# Patient Record
Sex: Female | Born: 1965 | Race: White | Hispanic: No | Smoking: Current every day smoker
Health system: Southern US, Community
[De-identification: ages and names within clinical notes are randomized; demographics above are authoritative.]

## PROBLEM LIST (undated history)

## (undated) DIAGNOSIS — I10 Essential (primary) hypertension: Secondary | ICD-10-CM

## (undated) DIAGNOSIS — K219 Gastro-esophageal reflux disease without esophagitis: Secondary | ICD-10-CM

## (undated) DIAGNOSIS — I639 Cerebral infarction, unspecified: Secondary | ICD-10-CM

## (undated) DIAGNOSIS — E119 Type 2 diabetes mellitus without complications: Secondary | ICD-10-CM

## (undated) DIAGNOSIS — R569 Unspecified convulsions: Secondary | ICD-10-CM

## (undated) DIAGNOSIS — J42 Unspecified chronic bronchitis: Secondary | ICD-10-CM

## (undated) DIAGNOSIS — I341 Nonrheumatic mitral (valve) prolapse: Secondary | ICD-10-CM

## (undated) DIAGNOSIS — F419 Anxiety disorder, unspecified: Secondary | ICD-10-CM

## (undated) DIAGNOSIS — F32A Depression, unspecified: Secondary | ICD-10-CM

## (undated) DIAGNOSIS — F329 Major depressive disorder, single episode, unspecified: Secondary | ICD-10-CM

## (undated) HISTORY — DX: Anxiety disorder, unspecified: F41.9

## (undated) HISTORY — DX: Unspecified convulsions: R56.9

## (undated) HISTORY — DX: Essential (primary) hypertension: I10

## (undated) HISTORY — DX: Nonrheumatic mitral (valve) prolapse: I34.1

## (undated) HISTORY — DX: Major depressive disorder, single episode, unspecified: F32.9

## (undated) HISTORY — DX: Depression, unspecified: F32.A

## (undated) HISTORY — DX: Type 2 diabetes mellitus without complications: E11.9

## (undated) HISTORY — PX: TOTAL ABDOMINAL HYSTERECTOMY: SHX209

## (undated) HISTORY — PX: ABDOMINAL HYSTERECTOMY: SHX81

## (undated) HISTORY — PX: APPENDECTOMY: SHX54

## (undated) HISTORY — PX: CHOLECYSTECTOMY: SHX55

## (undated) HISTORY — DX: Cerebral infarction, unspecified: I63.9

---

## 1988-09-01 HISTORY — PX: TOTAL ABDOMINAL HYSTERECTOMY: SHX209

## 2017-04-25 DIAGNOSIS — L0390 Cellulitis, unspecified: Secondary | ICD-10-CM | POA: Insufficient documentation

## 2017-04-25 DIAGNOSIS — R609 Edema, unspecified: Secondary | ICD-10-CM | POA: Insufficient documentation

## 2018-11-16 ENCOUNTER — Ambulatory Visit: Payer: Self-pay | Admitting: Physician Assistant

## 2018-11-19 ENCOUNTER — Ambulatory Visit: Payer: Self-pay | Admitting: Physician Assistant

## 2018-11-22 ENCOUNTER — Ambulatory Visit (INDEPENDENT_AMBULATORY_CARE_PROVIDER_SITE_OTHER): Payer: Self-pay | Admitting: Physician Assistant

## 2018-11-22 ENCOUNTER — Other Ambulatory Visit: Payer: Self-pay

## 2018-11-22 ENCOUNTER — Encounter: Payer: Self-pay | Admitting: Physician Assistant

## 2018-11-22 VITALS — BP 141/77 | HR 55 | Temp 98.1°F | Ht 64.5 in | Wt 232.5 lb

## 2018-11-22 DIAGNOSIS — R569 Unspecified convulsions: Secondary | ICD-10-CM

## 2018-11-22 DIAGNOSIS — E1169 Type 2 diabetes mellitus with other specified complication: Secondary | ICD-10-CM | POA: Insufficient documentation

## 2018-11-22 DIAGNOSIS — F339 Major depressive disorder, recurrent, unspecified: Secondary | ICD-10-CM

## 2018-11-22 DIAGNOSIS — Z6839 Body mass index (BMI) 39.0-39.9, adult: Secondary | ICD-10-CM

## 2018-11-22 DIAGNOSIS — M5442 Lumbago with sciatica, left side: Secondary | ICD-10-CM

## 2018-11-22 DIAGNOSIS — E119 Type 2 diabetes mellitus without complications: Secondary | ICD-10-CM | POA: Insufficient documentation

## 2018-11-22 DIAGNOSIS — I6320 Cerebral infarction due to unspecified occlusion or stenosis of unspecified precerebral arteries: Secondary | ICD-10-CM

## 2018-11-22 DIAGNOSIS — E1165 Type 2 diabetes mellitus with hyperglycemia: Secondary | ICD-10-CM

## 2018-11-22 DIAGNOSIS — Z8673 Personal history of transient ischemic attack (TIA), and cerebral infarction without residual deficits: Secondary | ICD-10-CM | POA: Insufficient documentation

## 2018-11-22 DIAGNOSIS — I69398 Other sequelae of cerebral infarction: Secondary | ICD-10-CM

## 2018-11-22 DIAGNOSIS — E782 Mixed hyperlipidemia: Secondary | ICD-10-CM

## 2018-11-22 DIAGNOSIS — E559 Vitamin D deficiency, unspecified: Secondary | ICD-10-CM

## 2018-11-22 DIAGNOSIS — Z79899 Other long term (current) drug therapy: Secondary | ICD-10-CM

## 2018-11-22 DIAGNOSIS — I10 Essential (primary) hypertension: Secondary | ICD-10-CM

## 2018-11-22 DIAGNOSIS — G8929 Other chronic pain: Secondary | ICD-10-CM

## 2018-11-22 DIAGNOSIS — E66812 Obesity, class 2: Secondary | ICD-10-CM

## 2018-11-22 MED ORDER — METFORMIN HCL ER 500 MG PO TB24: 500.0000 mg | ORAL_TABLET | Freq: Every day | ORAL | 2 refills | Status: DC

## 2018-11-22 MED ORDER — AMLODIPINE BESYLATE 10 MG PO TABS: 10.0000 mg | ORAL_TABLET | Freq: Every day | ORAL | 5 refills | Status: DC

## 2018-11-22 MED ORDER — ATORVASTATIN CALCIUM 40 MG PO TABS: 40.0000 mg | ORAL_TABLET | Freq: Every day | ORAL | 5 refills | Status: DC

## 2018-11-22 MED ORDER — VENLAFAXINE HCL ER 150 MG PO CP24: 150.0000 mg | ORAL_CAPSULE | Freq: Every day | ORAL | 5 refills | Status: DC

## 2018-11-22 MED ORDER — MIRTAZAPINE 30 MG PO TABS: 30.0000 mg | ORAL_TABLET | Freq: Every day | ORAL | 5 refills | Status: DC

## 2018-11-22 MED ORDER — PREDNISONE 50 MG PO TABS: ORAL_TABLET | ORAL | 0 refills | Status: DC

## 2018-11-22 MED ORDER — OXYCODONE-ACETAMINOPHEN 10-325 MG PO TABS: 1.0000 | ORAL_TABLET | Freq: Two times a day (BID) | ORAL | 0 refills | Status: DC | PRN

## 2018-11-22 MED ORDER — LEVETIRACETAM 500 MG PO TABS: 500.0000 mg | ORAL_TABLET | Freq: Two times a day (BID) | ORAL | 5 refills | Status: DC

## 2018-11-22 MED ORDER — VITAMIN D (ERGOCALCIFEROL) 1.25 MG (50000 UNIT) PO CAPS: 50000.0000 [IU] | ORAL_CAPSULE | ORAL | 5 refills | Status: DC

## 2018-11-22 NOTE — Progress Notes (Signed)
New Patient Office Visit  Subjective:  Patient ID: Samantha [name], female    DOB: 1966/07/20  Age: 53 y.o. MRN: [id number]  CC:  Chief Complaint  Patient presents with  . Establish Care    HPI Samantha [name] presents to get intervention for her low back pain.    3 years ago the pain started. She has never had any MRI or xray. She does not know if any specific injury. She has tried PT and chiropractic care for about 1 year. She was getting some better then for the last 5 months worse. Pain is left sided through butt and into hamstring. Pain is constant but some movement makes worse. She is taking 800mg  of motrin and indocet bid. She is using some CBD oil. Worse when standing and certain positions sitting. Once she gets started walking better. No bowel or bladder dysfunction. No saddle anesthesia.   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 She recently got divorced and living with sister. She has a hx of stroke and overweight. She wants to get healthier again.  Past Medical History:  Diagnosis Date  . Anxiety   . Depression   . Diabetes mellitus without complication (HCC)   . Hypertension   . Mitral valve prolapse   . Seizures (HCC)   . Stroke [facility])       Family History  Problem Relation Age of Onset  . Hypertension Father   . Heart attack Father   . Stroke Father   . Skin cancer Father   . Breast cancer Paternal Grandmother   . Heart attack Paternal Grandfather     Social History   Socioeconomic History  . Marital status: Divorced    Spouse name: Not on file  . Number of children: Not on file  . Years of education: Not on file  . Highest education level: Not on file  Occupational History  . Not on file  Social Needs  . Financial resource strain: Not on file  . Food insecurity:    Worry: Not on file    Inability: Not on file  . Transportation needs:    Medical: Not on file    Non-medical: Not on file  Tobacco Use  . Smoking status: Current Every Day  Smoker    Packs/day: 1.00    Years: 25.00    Pack years: 25.00  . Smokeless tobacco: Never Used  Substance and Sexual Activity  . Alcohol use: Yes    Comment: socail  . Drug use: Never  . Sexual activity: Not Currently    Partners: Male    Birth control/protection: Condom  Lifestyle  . Physical activity:    Days per week: Not on file    Minutes per session: Not on file  . Stress: Not on file  Relationships  . Social connections:    Talks on phone: Not on file    Gets together: Not on file    Attends religious service: Not on file    Active member of club or organization: Not on file    Attends meetings of clubs or organizations: Not on file    Relationship status: Not on file  . Intimate partner violence:    Fear of current or ex partner: Not on file    Emotionally abused: Not on file    Physically abused: Not on file    Forced sexual activity: Not on file  Other Topics Concern  . Not on file  Social  History Narrative  . Not on file    ROS Review of Systems  Objective:   Today's Vitals: BP (!) 141/77   Pulse (!) 55   Temp 98.1 F (36.7 C) (Oral)   Ht 5' 4.5" (1.638 m)   Wt 232 lb 8 oz (105.5 kg)   LMP  (LMP Unknown)   SpO2 100%   BMI 39.29 kg/m   Physical Exam Vitals signs reviewed.  Constitutional:      Appearance: She is obese.  HENT:     Head: Normocephalic.  Cardiovascular:     Rate and Rhythm: Normal rate and regular rhythm.  Pulmonary:     Effort: Pulmonary effort is normal.     Breath sounds: Normal breath sounds.  Musculoskeletal:     Comments: Decreased ROM at waist due to pain.  Pain to palpation over lumbar spine more over L4/5 and S1.  Straight leg produced axial back pain.   Neurological:     General: No focal deficit present.     Mental Status: She is alert and oriented to person, place, and time.  Psychiatric:     Comments: Flat affect     Assessment & Plan:   Problem List Items Addressed This Visit      Unprioritized    Type II diabetes mellitus (HCC)   Relevant Medications   aspirin EC 81 MG tablet   metFORMIN (GLUCOPHAGE-XR) 500 MG 24 hr tablet   atorvastatin (LIPITOR) 40 MG tablet   Other Relevant Orders   COMPLETE METABOLIC PANEL WITH GFR   Hemoglobin A1c   Seizure as late effect of cerebrovascular accident (CVA) (HCC)   Relevant Medications   levETIRAcetam (KEPPRA) 500 MG tablet   Cerebrovascular accident (CVA) due to occlusion of precerebral artery (HCC)   Relevant Medications   aspirin EC 81 MG tablet   amLODipine (NORVASC) 10 MG tablet   atorvastatin (LIPITOR) 40 MG tablet   Other Relevant Orders   Lipid Panel w/reflex Direct LDL   COMPLETE METABOLIC PANEL WITH GFR   Essential hypertension   Relevant Medications   aspirin EC 81 MG tablet   amLODipine (NORVASC) 10 MG tablet   atorvastatin (LIPITOR) 40 MG tablet   Other Relevant Orders   COMPLETE METABOLIC PANEL WITH GFR   Vitamin D deficiency   Relevant Medications   Vitamin D, Ergocalciferol, (DRISDOL) 1.25 MG (50000 UT) CAPS capsule   Chronic left-sided low back pain with left-sided sciatica - Primary   Relevant Medications   aspirin EC 81 MG tablet   predniSONE (DELTASONE) 50 MG tablet   oxyCODONE-acetaminophen (PERCOCET) 10-325 MG tablet   levETIRAcetam (KEPPRA) 500 MG tablet   mirtazapine (REMERON) 30 MG tablet   venlafaxine XR (EFFEXOR-XR) 150 MG 24 hr capsule   Other Relevant Orders   MR Lumbar Spine Wo Contrast   Pain Mgmt, Profile 6 Conf w/o mM, U (Completed)   Class 2 severe obesity due to excess calories with serious comorbidity and body mass index (BMI) of 39.0 to 39.9 in adult [facility])   Relevant Medications   metFORMIN (GLUCOPHAGE-XR) 500 MG 24 hr tablet   Mixed hyperlipidemia   Relevant Medications   aspirin EC 81 MG tablet   amLODipine (NORVASC) 10 MG tablet   atorvastatin (LIPITOR) 40 MG tablet   Other Relevant Orders   Lipid Panel w/reflex Direct LDL   COMPLETE METABOLIC PANEL WITH GFR    Other Visit  Diagnoses    Medication management       Relevant Orders  COMPLETE METABOLIC PANEL WITH GFR   Pain Mgmt, Profile 6 Conf w/o mM, U (Completed)   Depression, recurrent (HCC)       Relevant Medications   mirtazapine (REMERON) 30 MG tablet   venlafaxine XR (EFFEXOR-XR) 150 MG 24 hr capsule      Outpatient Encounter Medications as of 11/22/2018  Medication Sig  . amLODipine (NORVASC) 10 MG tablet Take 1 tablet (10 mg total) by mouth daily.  Marland Kitchen. aspirin EC 81 MG tablet Take 81 mg by mouth daily.  Marland Kitchen. atorvastatin (LIPITOR) 40 MG tablet Take 1 tablet (40 mg total) by mouth daily.  Marland Kitchen. levETIRAcetam (KEPPRA) 500 MG tablet Take 1 tablet (500 mg total) by mouth 2 (two) times daily.  . mirtazapine (REMERON) 30 MG tablet Take 1 tablet (30 mg total) by mouth at bedtime.  Marland Kitchen. oxyCODONE-acetaminophen (PERCOCET) 10-325 MG tablet Take 1 tablet by mouth every 12 (twelve) hours as needed for pain.  Marland Kitchen. venlafaxine XR (EFFEXOR-XR) 150 MG 24 hr capsule Take 1 capsule (150 mg total) by mouth daily with breakfast.  . Vitamin D, Ergocalciferol, (DRISDOL) 1.25 MG (50000 UT) CAPS capsule Take 1 capsule (50,000 Units total) by mouth every 7 (seven) days.  . [DISCONTINUED] amLODipine (NORVASC) 10 MG tablet Take 10 mg by mouth daily.  . [DISCONTINUED] atorvastatin (LIPITOR) 40 MG tablet Take 40 mg by mouth daily.  . [DISCONTINUED] levETIRAcetam (KEPPRA) 500 MG tablet Take 500 mg by mouth 2 (two) times daily.  . [DISCONTINUED] metFORMIN (GLUCOPHAGE) 500 MG tablet Take by mouth daily with breakfast.  . [DISCONTINUED] mirtazapine (REMERON) 30 MG tablet Take 30 mg by mouth at bedtime.  . [DISCONTINUED] oxyCODONE-acetaminophen (PERCOCET) 10-325 MG tablet Take 1 tablet by mouth every 12 (twelve) hours as needed for pain.  . [DISCONTINUED] venlafaxine XR (EFFEXOR-XR) 150 MG 24 hr capsule Take 150 mg by mouth daily with breakfast.  . [DISCONTINUED] Vitamin D, Ergocalciferol, (DRISDOL) 1.25 MG (50000 UT) CAPS capsule Take 50,000 Units  by mouth every 7 (seven) days.  . metFORMIN (GLUCOPHAGE-XR) 500 MG 24 hr tablet Take 1 tablet (500 mg total) by mouth daily with breakfast.  . predniSONE (DELTASONE) 50 MG tablet One tab PO daily for 5 days.   No facility-administered encounter medications on file as of 11/22/2018.    Since patient is self pay will try to get MRI first due to 3 years of back pain. Prednisone for 5 days.   Medications are refilled.   Pain contract signed and UDS ordered.   .. Opioid Risk Tool - 11/22/18 1037      Family History of Substance Abuse   Alcohol  Positive Female    Rx Drugs  Negative      Personal History of Substance Abuse   Alcohol  Negative    Illegal Drugs  Negative    Rx Drugs  Negative      Age   Age between 16-45 years   No      History of Preadolescent Sexual Abuse   History of Preadolescent Sexual Abuse  Negative or Female      Psychological Disease   Psychological Disease  Negative    Depression  Positive      Total Score   Opioid Risk Tool Scoring  2    Opioid Risk Interpretation  Low Risk      Labs ordered to follow up on medication.  Follow-up: No follow-ups on file.   Tandy GawJade Dandra Velardi, PA-C

## 2018-11-25 LAB — PAIN MGMT, PROFILE 6 CONF W/O MM, U
6 Acetylmorphine: NEGATIVE ng/mL (ref <10)
Alcohol Metabolites: NEGATIVE ng/mL (ref <500)
Alphahydroxyalprazolam: 297 ng/mL — ABNORMAL HIGH (ref <25)
Alphahydroxymidazolam: NEGATIVE ng/mL (ref <50)
Alphahydroxytriazolam: NEGATIVE ng/mL (ref <50)
Aminoclonazepam: 488 ng/mL — ABNORMAL HIGH (ref <25)
Amphetamine: 1577 ng/mL — ABNORMAL HIGH (ref <250)
Amphetamines: POSITIVE ng/mL — AB (ref <500)
Barbiturates: NEGATIVE ng/mL (ref <300)
Benzodiazepines: POSITIVE ng/mL — AB (ref <100)
Cocaine Metabolite: NEGATIVE ng/mL (ref <150)
Codeine: NEGATIVE ng/mL (ref <50)
Creatinine: 219.1 mg/dL
Hydrocodone: NEGATIVE ng/mL (ref <50)
Hydromorphone: NEGATIVE ng/mL (ref <50)
Hydroxyethylflurazepam: NEGATIVE ng/mL (ref <50)
Lorazepam: NEGATIVE ng/mL (ref <50)
Marijuana Metabolite: 42 ng/mL — ABNORMAL HIGH (ref <5)
Marijuana Metabolite: POSITIVE ng/mL — AB (ref <20)
Methadone Metabolite: NEGATIVE ng/mL (ref <100)
Methamphetamine: NEGATIVE ng/mL (ref <250)
Morphine: NEGATIVE ng/mL (ref <50)
Nordiazepam: NEGATIVE ng/mL (ref <50)
Norhydrocodone: NEGATIVE ng/mL (ref <50)
Noroxycodone: 4170 ng/mL — ABNORMAL HIGH (ref <50)
Opiates: NEGATIVE ng/mL (ref <100)
Oxazepam: NEGATIVE ng/mL (ref <50)
Oxidant: NEGATIVE ug/mL (ref <200)
Oxycodone: 4931 ng/mL — ABNORMAL HIGH (ref <50)
Oxycodone: POSITIVE ng/mL — AB (ref <100)
Oxymorphone: 3407 ng/mL — ABNORMAL HIGH (ref <50)
Phencyclidine: NEGATIVE ng/mL (ref <25)
Temazepam: NEGATIVE ng/mL (ref <50)
pH: 6.61 (ref 4.5–9.0)

## 2018-11-26 DIAGNOSIS — G8929 Other chronic pain: Secondary | ICD-10-CM | POA: Insufficient documentation

## 2018-11-26 DIAGNOSIS — M5442 Lumbago with sciatica, left side: Principal | ICD-10-CM

## 2018-11-26 DIAGNOSIS — E782 Mixed hyperlipidemia: Secondary | ICD-10-CM | POA: Insufficient documentation

## 2018-11-26 DIAGNOSIS — Z6839 Body mass index (BMI) 39.0-39.9, adult: Secondary | ICD-10-CM

## 2018-11-26 DIAGNOSIS — Z6837 Body mass index (BMI) 37.0-37.9, adult: Secondary | ICD-10-CM | POA: Insufficient documentation

## 2018-11-26 NOTE — Progress Notes (Signed)
Drug screen for norco rx back. It shows things in your urine that are not on your med list and marijuana which under pain contract you cannot take. Can you explain benzodiazapine's and amphetamines?

## 2018-11-29 ENCOUNTER — Telehealth: Payer: Self-pay | Admitting: Physician Assistant

## 2018-11-29 ENCOUNTER — Other Ambulatory Visit: Payer: Self-pay | Admitting: Physician Assistant

## 2018-11-29 DIAGNOSIS — M5442 Lumbago with sciatica, left side: Principal | ICD-10-CM

## 2018-11-29 DIAGNOSIS — G8929 Other chronic pain: Secondary | ICD-10-CM

## 2018-11-29 NOTE — Telephone Encounter (Signed)
Can we call patient and let her know I was out of town on Friday. My supervising physician was forwarded the urine drug screen and made the determination of no more pain medications from this office. I am more than happy to get you to pain clinic. I would suggest though to stop using stimulants and benzodiazapine's as they will do similar drug screens. Hopefully we can get mRI scheduled soon.

## 2018-11-29 NOTE — Telephone Encounter (Signed)
Called and advised patient. She was very tearful but states she is interested in pain clinic referral.

## 2018-11-30 NOTE — Telephone Encounter (Signed)
Done. Referral placed.

## 2018-12-01 ENCOUNTER — Telehealth: Payer: Self-pay | Admitting: Physician Assistant

## 2018-12-01 ENCOUNTER — Encounter: Payer: Self-pay | Admitting: Physician Assistant

## 2018-12-01 DIAGNOSIS — F341 Dysthymic disorder: Secondary | ICD-10-CM | POA: Insufficient documentation

## 2018-12-01 DIAGNOSIS — K219 Gastro-esophageal reflux disease without esophagitis: Secondary | ICD-10-CM | POA: Insufficient documentation

## 2018-12-01 DIAGNOSIS — R5382 Chronic fatigue, unspecified: Secondary | ICD-10-CM | POA: Insufficient documentation

## 2018-12-01 DIAGNOSIS — R7303 Prediabetes: Secondary | ICD-10-CM | POA: Insufficient documentation

## 2018-12-01 DIAGNOSIS — D519 Vitamin B12 deficiency anemia, unspecified: Secondary | ICD-10-CM | POA: Insufficient documentation

## 2018-12-01 MED ORDER — PREDNISONE 10 MG (48) PO TBPK: ORAL_TABLET | ORAL | 0 refills | Status: DC

## 2018-12-01 NOTE — Telephone Encounter (Signed)
Pt calls in and states prednisone burst is doing amazing. She is walking better. She is feels less pain. She took her last dose and worried about the pain starting back.   Since she had such a great response I am going to do longer taper while we wait for MRI. She may really benefit from injections.

## 2019-01-31 ENCOUNTER — Ambulatory Visit (INDEPENDENT_AMBULATORY_CARE_PROVIDER_SITE_OTHER): Payer: Self-pay | Admitting: Physician Assistant

## 2019-01-31 ENCOUNTER — Telehealth: Payer: Self-pay

## 2019-01-31 VITALS — BP 140/74 | HR 74 | Temp 98.2°F | Wt 214.0 lb

## 2019-01-31 DIAGNOSIS — B3731 Acute candidiasis of vulva and vagina: Secondary | ICD-10-CM

## 2019-01-31 DIAGNOSIS — B373 Candidiasis of vulva and vagina: Secondary | ICD-10-CM

## 2019-01-31 DIAGNOSIS — E1165 Type 2 diabetes mellitus with hyperglycemia: Secondary | ICD-10-CM

## 2019-01-31 MED ORDER — FLUCONAZOLE 150 MG PO TABS: ORAL_TABLET | ORAL | 0 refills | Status: DC

## 2019-01-31 MED ORDER — GLIPIZIDE 10 MG PO TABS: 10.0000 mg | ORAL_TABLET | Freq: Two times a day (BID) | ORAL | 1 refills | Status: DC

## 2019-01-31 MED ORDER — "INSULIN SYRINGE 29G X 1/2"" 1 ML MISC": 0 refills | Status: DC

## 2019-01-31 MED ORDER — INSULIN NPH (HUMAN) (ISOPHANE) 100 UNIT/ML ~~LOC~~ SUSP: SUBCUTANEOUS | 1 refills | Status: DC

## 2019-01-31 NOTE — Progress Notes (Signed)
Patient ID: Samantha [name], female   DOB: Nov 28, 1965, 53 y.o.   MRN: [id number] .Marland KitchenVirtual Visit via Video Note  I connected with Samantha [name] on 02/02/19 at  3:20 PM EDT by a video enabled telemedicine application and verified that I am speaking with the correct person using two identifiers.  Location: Patient: home Provider: clinic   I discussed the limitations of evaluation and management by telemedicine and the availability of in person appointments. The patient expressed understanding and agreed to proceed.  History of Present Illness: Pt is a 53 yo female with previous dx of pre-diabetes who calls into the clinic with elevated BS at greater than 500 for the last 2 days. Labs were ordered on 11/22/18. She never had drawn. She has been on prednisone for the last few months for her chronic back pain. She has excessive thirst, increased urinary frequency, yeast infections. She just got her glucometer and able to check her sugars.   Chronic back pain continues. It comes right back without prednisone. She has MRI on Sunday. Ibuprofen helps some.   .. Active Ambulatory Problems    Diagnosis Date Noted  . Type II diabetes mellitus (HCC) 11/22/2018  . Seizure as late effect of cerebrovascular accident (CVA) (HCC) 11/22/2018  . Cerebrovascular accident (CVA) due to occlusion of precerebral artery (HCC) 11/22/2018  . Essential hypertension 11/22/2018  . Vitamin D deficiency 11/22/2018  . Chronic left-sided low back pain with left-sided sciatica 11/26/2018  . Class 2 severe obesity due to excess calories with serious comorbidity and body mass index (BMI) of 39.0 to 39.9 in adult [facility]) 11/26/2018  . Mixed hyperlipidemia 11/26/2018  . B12 deficiency anemia 12/01/2018  . Chronic fatigue 12/01/2018  . GERD (gastroesophageal reflux disease) 12/01/2018  . Prediabetes 12/01/2018  . Dysthymia 12/01/2018   Resolved Ambulatory Problems    Diagnosis Date Noted  . No Resolved Ambulatory Problems    Past Medical History:  Diagnosis Date  . Anxiety   . Depression   . Diabetes mellitus without complication (HCC)   . Hypertension   . Mitral valve prolapse   . Seizures (HCC)   . Stroke [facility])    Reviewed med, allergy, problem list.    Observations/Objective: No acute distress. Normal breathing.  Normal mood.   .. Today's Vitals   01/31/19 1508  BP: 140/74  Pulse: 74  Temp: 98.2 F (36.8 C)  TempSrc: Oral  Weight: 214 lb (97.1 kg)   Body mass index is 36.17 kg/m.   Assessment and Plan: Marland KitchenMarland KitchenDiagnoses and all orders for this visit:  Type 2 diabetes mellitus with hyperglycemia, without long-term current use of insulin (HCC) -     insulin NPH Human (NOVOLIN N RELION) 100 UNIT/ML injection; Take 15 units of insulin in the morning before breakfast and before bed. Increase by 2 units every 2 days until fasting glucose is 90-130 and evening before bed glucose 120-180. -     glipiZIDE (GLUCOTROL) 10 MG tablet; Take 1 tablet (10 mg total) by mouth 2 (two) times daily before a meal. -     INSULIN SYRINGE 1CC/29G 29G X 1/2" 1 ML MISC; Use with novolin vials twice a day.  Yeast infection of the vagina -     fluconazole (DIFLUCAN) 150 MG tablet; Take one tablet as needed for yeast infection symptoms repeat in 48-72 hours if symptoms persist.  hyperglycemia could be prednisone induced. Only insulin can get this down fast enough. novolin sent to pharmacy because pt is self pay. Start  15 units bid. Start glipizide. Pt is off prednisone. Continue to monitor glucose. Increase novalin by 2 units every 3 days until fasting glucose 90-130 in am and 120-180 in pm. Discussed diabetic diet. Follow up on Friday for recheck. Discussed DKA.   Sent diflucan for yeast infection.    Follow Up Instructions:    I discussed the assessment and treatment plan with the patient. The patient was provided an opportunity to ask questions and all were answered. The patient agreed with the plan and  demonstrated an understanding of the instructions.   The patient was advised to call back or seek an in-person evaluation if the symptoms worsen or if the condition fails to improve as anticipated.     Tandy GawJade Abram Sax, PA-C

## 2019-01-31 NOTE — Telephone Encounter (Signed)
Ok thanks at least I can talk to her this way.

## 2019-01-31 NOTE — Progress Notes (Signed)
Patient has a several week history of dizziness, blurred vision, excessive thirst, and frequent urination. She hasn't had a glucometer, just got one today and blood sugar at 11am just read "high" (if up to 500 it will give a reading). Reading now is 434. Taking Metformin XR.

## 2019-01-31 NOTE — Telephone Encounter (Signed)
Samantha [name] called and states her glucometer is reading HI. So her blood sugar is over 500 mg/dl. I advised her that with a blood sugar that high she really needed to go to the ED. She states she is not going to the ED. I did schedule her today for a virtual visit with Jade this afternoon. She also has a vaginal yeast infection. She would like diflucan.

## 2019-02-02 ENCOUNTER — Encounter: Payer: Self-pay | Admitting: Physician Assistant

## 2019-02-03 ENCOUNTER — Telehealth: Payer: Self-pay

## 2019-02-03 NOTE — Telephone Encounter (Signed)
[name] called and states the Glipizide is causing her to be very nauseated. She states she will not be able to take this medication. Please advise.

## 2019-02-04 NOTE — Telephone Encounter (Signed)
Left message advising of recommendations.  

## 2019-02-04 NOTE — Telephone Encounter (Signed)
Ok to stick with just insulin and metformin for now then. She may have to titrate to more insulin. Unfortunately all other DM medication are branded and so without insurance we don't have a lot to choose from. Is she taking with meal? She could try just one a day.

## 2019-02-06 ENCOUNTER — Encounter: Payer: Self-pay | Admitting: Physician Assistant

## 2019-02-06 ENCOUNTER — Other Ambulatory Visit: Payer: Self-pay

## 2019-02-06 ENCOUNTER — Ambulatory Visit (INDEPENDENT_AMBULATORY_CARE_PROVIDER_SITE_OTHER): Payer: Self-pay

## 2019-02-06 DIAGNOSIS — M5442 Lumbago with sciatica, left side: Secondary | ICD-10-CM

## 2019-02-06 DIAGNOSIS — M5136 Other intervertebral disc degeneration, lumbar region: Secondary | ICD-10-CM | POA: Insufficient documentation

## 2019-02-06 DIAGNOSIS — G8929 Other chronic pain: Secondary | ICD-10-CM

## 2019-02-06 NOTE — Progress Notes (Signed)
Call pt:  You do have some disc slippage at L4/L5. You have some narrowing of foramen and disc bulge.  I would like for you to see dr. Georgina Snell or DR. T. I think you would benefit from steroid injections.

## 2019-02-08 ENCOUNTER — Ambulatory Visit (INDEPENDENT_AMBULATORY_CARE_PROVIDER_SITE_OTHER): Payer: Self-pay | Admitting: Family Medicine

## 2019-02-08 ENCOUNTER — Encounter: Payer: Self-pay | Admitting: Family Medicine

## 2019-02-08 VITALS — BP 152/83 | HR 83 | Temp 98.1°F | Wt 225.0 lb

## 2019-02-08 DIAGNOSIS — M48061 Spinal stenosis, lumbar region without neurogenic claudication: Secondary | ICD-10-CM

## 2019-02-08 DIAGNOSIS — M545 Low back pain: Secondary | ICD-10-CM

## 2019-02-08 DIAGNOSIS — M4310 Spondylolisthesis, site unspecified: Secondary | ICD-10-CM

## 2019-02-08 DIAGNOSIS — G8929 Other chronic pain: Secondary | ICD-10-CM

## 2019-02-08 MED ORDER — GABAPENTIN 300 MG PO CAPS: ORAL_CAPSULE | ORAL | 3 refills | Status: DC

## 2019-02-08 NOTE — Patient Instructions (Addendum)
Thank you for coming in today. [facility]  You should hear about back injection.  Start gabapentin.  Keep me updated.     Spondylolisthesis  Spondylolisthesis is when one of the bones in the spine (vertebra) slips forward and out of place. This commonly occurs in the lower back (lumbar spine), but it can happen anywhere along the spine. What are the causes? This condition may be caused by:  A break or crack (stress fracture) in a bone in the spine from doing sports or physical activities that: ? Put a lot of strain on the bones in the lower back. ? Involve repetitive overstretching (hyperextension) of the spine.  Injury (trauma) from an accident.  Wear and tear that happens as a person grows older. What increases the risk? The following factors may make you more likely to develop this condition:  Participating in sports or activities such as: ? Gymnastics. ? Figure skating. ? Weight lifting. ? Football.  Having a condition that affects the bones, such as osteoarthritis or cancer.  Being overweight. What are the signs or symptoms? Symptoms of this condition may include:  Mild to severe pain in the legs, lower back, or buttocks.  An abnormal way of walking (abnormal gait).  Poor posture.  Muscle stiffness, specifically in the hamstrings. The hamstrings are in the backs of the thighs.  Weakness, numbness, or a tingling sensation in the legs.  Neck pain, if the injury is at the top of the spine. Symptoms may get worse when standing, and they may temporarily get better when sitting down or bending forward. In some cases, there may be no symptoms of this condition. How is this diagnosed? This condition may be diagnosed based on:  Your symptoms.  Your medical history.  A physical exam.  Imaging tests, such as: ? X-rays. ? CT scan. ? MRI. How is this treated? This condition may be treated by:  Resting.  Pain  medicines.  NSAIDs, like ibuprofen, to help reduce swelling and discomfort.  Injections of medicine (cortisone) in your back. These injections can help to relieve pain and numbness.  A brace to stabilize and support your back.  Physical therapy. You may work with an occupational therapist or physical therapist who can teach you how to reduce pressure on your back while you do everyday activities.  Surgery. This may be needed if: ? Other treatment methods do not improve your condition. ? Your symptoms do not go away after 3-6 months. ? You have changes in control of your stool or urine. ? You are unable to walk or stand. ? You have severe pain. Follow these instructions at home: Medicines  Take over-the-counter and prescription medicines only as told by your health care provider.  Ask your health care provider if the medicine prescribed to you: ? Requires you to avoid driving or using heavy machinery. ? Can cause constipation. You may need to take these actions to prevent or treat constipation:  Drink enough fluid to keep your urine pale yellow.  Take over-the-counter or prescription medicines.  Eat foods that are high in fiber, such as beans, whole grains, and fresh fruits and vegetables.  Limit foods that are high in fat and processed sugars, such as fried or sweet foods. If you have a brace:  Wear the brace as told by your health care provider. Remove it only as told by your health care provider.  Keep the brace clean.  If the brace is not  waterproof: ? Do not let it get wet. ? Cover it with a watertight covering when you take a bath or a shower. Activity  Rest and return to your normal activities as told by your health care provider. Ask your health care provider what activities are safe for you.  Ask your health care provider when it is safe to drive if you have a back brace.  Work with a physical therapist to make a safe exercise program, as recommended by your  health care provider. Do exercises as told by your physical therapist. This may include exercises to strengthen your back and abdominal muscles (core exercises). Managing pain, stiffness, and swelling      If directed, put ice on the affected area. ? If you have a removable brace, remove it as told by your health care provider. ? Put ice in a plastic bag. ? Place a towel between your skin and the bag. ? Leave the ice on for 20 minutes, 2-3 times a day.  If directed, apply heat to the affected area as often as told by your health care provider. Use the heat source that your health care provider recommends, such as a moist heat pack or a heating pad. ? If you have a removable brace, remove it as told by your health care provider. ? Place a towel between your skin and the heat source. ? Leave the heat on for 20-30 minutes. ? Remove the heat if your skin turns bright red. This is especially important if you are unable to feel pain, heat, or cold. You may have a greater risk of getting burned. General instructions  Do not use any products that contain nicotine or tobacco, such as cigarettes, e-cigarettes, and chewing tobacco. These can delay bone healing. If you need help quitting, ask your health care provider.  If you are overweight, work with your health care provider and a dietitian to set a weight-loss goal that is healthy and reasonable for you.  Keep all follow-up visits as told by your health care provider. This is important. Contact a health care provider if:  You have pain that gets worse or does not get better. Get help right away if:  You have severe back or neck pain.  You have changes in control of your stool or urine.  You develop weakness or numbness in your legs.  You are unable to stand or walk. Summary  Spondylolisthesis is when one of the bones in the spine (vertebra) slips forward and out of place.  This condition may be treated with rest, medicines, wearing a  brace, physical therapy, or surgery.  Rest and return to your normal activities as told by your health care provider. Ask your health care provider what activities are safe for you.  Contact a health care provider if you have pain that gets worse or does not get better. This information is not intended to replace advice given to you by your health care provider. Make sure you discuss any questions you have with your health care provider. Document Released: 08/18/2005 Document Revised: 03/23/2018 Document Reviewed: 03/23/2018 Elsevier Interactive Patient Education  2019 [employer].

## 2019-02-08 NOTE — Progress Notes (Signed)
Samantha [name] is a 53 y.o. female who presents to [facility] today for chronic back pain.  Patient has a 2-year history of chronic back pain worsening over the last 8 months.  Pain is located in her central lower back and radiates to the left buttock.  She denies significant pain radiating below the level of her knee.  She denies any weakness or numbness distally.  She notes pain is worse with prolonged standing back extension.  Pain is better with flexion and sitting.  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  She no longer has health insurance either.  She had evaluation with her PCP recently and after some work-up had an MRI of her lumbar spine which showed grade 1 anterior listhesis at L4-5 and facet arthropathy and moderate spinal stenosis and lateral recess stenosis at L5.  She is had trials of prednisone for her back pain which do help temporarily but tend to increase her blood sugar.   ROS:  As above  Exam:  BP (!) 152/83    Pulse 83    Temp 98.1 F (36.7 C) (Oral)    Wt 225 lb (102.1 kg)    LMP  (LMP Unknown)    BMI 38.02 kg/m  Wt Readings from Last 5 Encounters:  02/08/19 225 lb (102.1 kg)  01/31/19 214 lb (97.1 kg)  11/22/18 232 lb 8 oz (105.5 kg)   General: Well Developed, well nourished, and in no acute distress.  Neuro/Psych: Alert and oriented x3, extra-ocular muscles intact, able to move all 4 extremities, sensation grossly intact. Skin: Warm and dry, no rashes noted.  Respiratory: Not using accessory muscles, speaking in full sentences, trachea midline.  Cardiovascular: Pulses palpable, no extremity edema. Abdomen: Does not appear distended. MSK: L-spine: Nontender to midline.  Tender palpation bilateral lumbar paraspinal musculature. Lumbar motion normal flexion.  Limited extension.  Normal rotation lateral flexion. Lower extremity strength is equal normal throughout. Reflexes equal  normal throughout.  Sensation is intact throughout bilateral extremities.    Lab and Radiology Results No results found for this or any previous visit (from the past 72 hour(s)). Mr Lumbar Spine Wo Contrast  Result Date: 02/06/2019 CLINICAL DATA:  53 year old female with low back pain radiating to the left buttock for 2+ years. No known injury. EXAM: MRI LUMBAR SPINE WITHOUT CONTRAST TECHNIQUE: Multiplanar, multisequence MR imaging of the lumbar spine was performed. No intravenous contrast was administered. COMPARISON:  None. FINDINGS: Segmentation: Lumbar segmentation appears to be normal and will be designated as such for this report. Alignment: Grade 1 anterolisthesis of L4 on L5 measures 4-5 millimeters. Relatively preserved lumbar lordosis elsewhere. Vertebrae: No marrow edema or evidence of acute osseous abnormality. Visualized bone marrow signal is within normal limits. Intact visible sacrum and SI joints. Conus medullaris and cauda equina: Conus extends to L1. No lower spinal cord or conus signal abnormality. Paraspinal and other soft tissues: Negative. Disc levels: T10-T11: Partially visible, grossly negative. T11-T12: Negative. T12-L1:  Negative. L1-L2:  Mild facet hypertrophy, otherwise negative. L2-L3:  Mild-to-moderate facet hypertrophy, otherwise negative. L3-L4: Mild far lateral disc bulging and endplate spurring. Mild facet hypertrophy. No significant stenosis. L4-L5: Grade 1 anterolisthesis. Disc desiccation and mild circumferential disc bulge. Moderate facet hypertrophy greater on the left. Mild to moderate spinal and bilateral lateral recess stenosis. No significant L4 foraminal stenosis. L5-S1: Mild disc bulge and endplate spurring. Mild to moderate facet hypertrophy. No spinal or lateral  recess stenosis. Borderline to mild L5 foraminal stenosis mostly on the left. IMPRESSION: 1. Grade 1 anterolisthesis at L4-L5 with facet arthropathy and multifactorial mild to moderate spinal and  bilateral lateral recess stenosis (L5 nerve levels). 2. Lesser disc and posterior element degeneration at L5-S1 with up to mild L5 neural foraminal stenosis. 3. Generally mild lumbar facet degeneration elsewhere. Electronically Signed   By: Genevie Ann M.D.   On: 02/06/2019 19:15    I personally (independently) visualized and performed the interpretation of the images attached in this note.    Assessment and Plan: 53 y.o. female with chronic low back pain.  Patient has anterior listhesis and spinal stenosis.  Her symptoms correspond to this as well.  Plan to proceed with epidural steroid injection and will is trial of gabapentin.  Most importantly however will refer to physical therapy.  Her lack of insurance is certainly can limit all of these options.  We will try to use the pro bono [facility] clinic.  Additionally if she can find a job that will provide [employer] to be very helpful for her back pain control.  Does the core strengthening is going to be very important for her ability to have improved back pain.   PDMP not reviewed this encounter. Orders Placed This Encounter  Procedures   DG Epidurography    Order Specific Question:   Reason for Exam (SYMPTOM  OR DIAGNOSIS REQUIRED)    Answer:   L4-5 BL or interlaminar    Order Specific Question:   Is the patient pregnant?    Answer:   No    Order Specific Question:   Preferred imaging location?    Answer:   [id number]   Ambulatory referral to Physical Therapy    Referral Priority:   Routine    Referral Type:   Physical Medicine    Referral Reason:   Specialty Services Required    Requested Specialty:   Physical Therapy    Number of Visits Requested:   1   Meds ordered this encounter  Medications   gabapentin (NEURONTIN) 300 MG capsule    Sig: One tab PO qHS for a week, then BID for a week, then TID. May double weekly to a max of 3,600mg /day    Dispense:  180 capsule    Refill:  3    Historical information  moved to improve visibility of documentation.  Past Medical History:  Diagnosis Date   Anxiety    Depression    Diabetes mellitus without complication ([place])    Hypertension    Mitral valve prolapse    Seizures (HCC)    Stroke [facility])    Past Surgical History:  Procedure Laterality Date   ABDOMINAL HYSTERECTOMY     CHOLECYSTECTOMY     Social History   Tobacco Use   Smoking status: Current Every Day Smoker    Packs/day: 1.00    Years: 25.00    Pack years: 25.00   Smokeless tobacco: Never Used  Substance Use Topics   Alcohol use: Yes    Comment: socail   family history includes Breast cancer in her paternal grandmother; Heart attack in her father and paternal grandfather; Hypertension in her father; Skin cancer in her father; Stroke in her father.  Medications: Current Outpatient Medications  Medication Sig Dispense Refill   amLODipine (NORVASC) 10 MG tablet Take 1 tablet (10 mg total) by mouth daily. 30 tablet 5   aspirin EC 81 MG tablet Take 81 mg by  mouth daily.     atorvastatin (LIPITOR) 40 MG tablet Take 1 tablet (40 mg total) by mouth daily. 30 tablet 5   fluconazole (DIFLUCAN) 150 MG tablet Take one tablet as needed for yeast infection symptoms repeat in 48-72 hours if symptoms persist. 4 tablet 0   glipiZIDE (GLUCOTROL) 10 MG tablet Take 1 tablet (10 mg total) by mouth 2 (two) times daily before a meal. 60 tablet 1   insulin NPH Human (NOVOLIN N RELION) 100 UNIT/ML injection Take 15 units of insulin in the morning before breakfast and before bed. Increase by 2 units every 2 days until fasting glucose is 90-130 and evening before bed glucose 120-180. 10 mL 1   INSULIN SYRINGE 1CC/29G 29G X 1/2" 1 ML MISC Use with novolin vials twice a day. 100 each 0   levETIRAcetam (KEPPRA) 500 MG tablet Take 1 tablet (500 mg total) by mouth 2 (two) times daily. 60 tablet 5   metFORMIN (GLUCOPHAGE-XR) 500 MG 24 hr tablet Take 1 tablet (500 mg total) by mouth daily with  breakfast. 30 tablet 2   mirtazapine (REMERON) 30 MG tablet Take 1 tablet (30 mg total) by mouth at bedtime. 30 tablet 5   oxyCODONE-acetaminophen (PERCOCET) 10-325 MG tablet Take 1 tablet by mouth every 12 (twelve) hours as needed for pain. 60 tablet 0   venlafaxine XR (EFFEXOR-XR) 150 MG 24 hr capsule Take 1 capsule (150 mg total) by mouth daily with breakfast. 30 capsule 5   Vitamin D, Ergocalciferol, (DRISDOL) 1.25 MG (50000 UT) CAPS capsule Take 1 capsule (50,000 Units total) by mouth every 7 (seven) days. 4 capsule 5   gabapentin (NEURONTIN) 300 MG capsule One tab PO qHS for a week, then BID for a week, then TID. May double weekly to a max of 3,600mg /day 180 capsule 3   No current facility-administered medications for this visit.    Allergies  Allergen Reactions   Ace Inhibitors Swelling   Beta Adrenergic Blockers Other (See Comments)    Low heart rate      Discussed warning signs or symptoms. Please see discharge instructions. Patient expresses understanding.

## 2019-02-09 DIAGNOSIS — M48061 Spinal stenosis, lumbar region without neurogenic claudication: Secondary | ICD-10-CM | POA: Insufficient documentation

## 2019-02-09 DIAGNOSIS — M4310 Spondylolisthesis, site unspecified: Secondary | ICD-10-CM | POA: Insufficient documentation

## 2019-02-28 ENCOUNTER — Ambulatory Visit
Admission: RE | Admit: 2019-02-28 | Discharge: 2019-02-28 | Disposition: A | Discharge status: Home / Self Care | Payer: No Typology Code available for payment source | Source: Ambulatory Visit | Attending: Family Medicine | Admitting: Family Medicine

## 2019-02-28 ENCOUNTER — Other Ambulatory Visit: Payer: Self-pay

## 2019-02-28 MED ORDER — METHYLPREDNISOLONE ACETATE 40 MG/ML INJ SUSP (RADIOLOG
120.0000 mg | Freq: Once | INTRAMUSCULAR | Status: AC
  Administered 2019-02-28: 120 mg via EPIDURAL

## 2019-02-28 MED ORDER — IOPAMIDOL (ISOVUE-M 200) INJECTION 41%
1.0000 mL | Freq: Once | INTRAMUSCULAR | Status: AC
  Administered 2019-02-28: 1 mL via EPIDURAL

## 2019-02-28 NOTE — Discharge Instructions (Signed)

## 2019-03-04 ENCOUNTER — Other Ambulatory Visit: Payer: Self-pay | Admitting: Physician Assistant

## 2019-03-04 DIAGNOSIS — E1165 Type 2 diabetes mellitus with hyperglycemia: Secondary | ICD-10-CM

## 2019-03-14 ENCOUNTER — Other Ambulatory Visit: Payer: No Typology Code available for payment source

## 2019-03-14 ENCOUNTER — Other Ambulatory Visit: Payer: Self-pay

## 2019-03-14 DIAGNOSIS — Z20822 Contact with and (suspected) exposure to covid-19: Secondary | ICD-10-CM

## 2019-03-17 ENCOUNTER — Other Ambulatory Visit: Payer: Self-pay | Admitting: Physician Assistant

## 2019-03-17 DIAGNOSIS — E1165 Type 2 diabetes mellitus with hyperglycemia: Secondary | ICD-10-CM

## 2019-03-17 MED ORDER — INSULIN NPH (HUMAN) (ISOPHANE) 100 UNIT/ML ~~LOC~~ SUSP: SUBCUTANEOUS | 1 refills | Status: DC

## 2019-03-18 LAB — NOVEL CORONAVIRUS, NAA: SARS-CoV-2, NAA: NOT DETECTED

## 2019-05-11 ENCOUNTER — Telehealth: Payer: Self-pay | Admitting: Family Medicine

## 2019-05-11 DIAGNOSIS — M48061 Spinal stenosis, lumbar region without neurogenic claudication: Secondary | ICD-10-CM

## 2019-05-11 NOTE — Telephone Encounter (Signed)
Repeat injection ordered

## 2019-05-11 NOTE — Telephone Encounter (Signed)
Jenny Reichmann can you call and get this scheduled

## 2019-05-25 NOTE — Telephone Encounter (Signed)
CMA's Schedule these I gave to [facility] to schedule - CF

## 2019-06-01 ENCOUNTER — Other Ambulatory Visit: Payer: Self-pay

## 2019-06-01 ENCOUNTER — Ambulatory Visit
Admission: RE | Admit: 2019-06-01 | Discharge: 2019-06-01 | Disposition: A | Discharge status: Home / Self Care | Payer: No Typology Code available for payment source | Source: Ambulatory Visit | Attending: Family Medicine | Admitting: Family Medicine

## 2019-06-01 DIAGNOSIS — M48061 Spinal stenosis, lumbar region without neurogenic claudication: Secondary | ICD-10-CM

## 2019-06-01 MED ORDER — IOPAMIDOL (ISOVUE-M 200) INJECTION 41%
1.0000 mL | Freq: Once | INTRAMUSCULAR | Status: AC
  Administered 2019-06-01: 1 mL via EPIDURAL

## 2019-06-01 MED ORDER — METHYLPREDNISOLONE ACETATE 40 MG/ML INJ SUSP (RADIOLOG
120.0000 mg | Freq: Once | INTRAMUSCULAR | Status: AC
  Administered 2019-06-01: 120 mg via EPIDURAL

## 2019-06-01 NOTE — Discharge Instructions (Signed)

## 2019-06-13 ENCOUNTER — Other Ambulatory Visit: Payer: Self-pay | Admitting: Physician Assistant

## 2019-06-13 DIAGNOSIS — I10 Essential (primary) hypertension: Secondary | ICD-10-CM

## 2019-06-13 DIAGNOSIS — E1165 Type 2 diabetes mellitus with hyperglycemia: Secondary | ICD-10-CM

## 2019-07-07 ENCOUNTER — Other Ambulatory Visit: Payer: Self-pay | Admitting: Physician Assistant

## 2019-07-07 DIAGNOSIS — F339 Major depressive disorder, recurrent, unspecified: Secondary | ICD-10-CM

## 2019-07-07 DIAGNOSIS — E559 Vitamin D deficiency, unspecified: Secondary | ICD-10-CM

## 2019-08-22 ENCOUNTER — Other Ambulatory Visit: Payer: Self-pay | Admitting: Physician Assistant

## 2019-08-22 DIAGNOSIS — F339 Major depressive disorder, recurrent, unspecified: Secondary | ICD-10-CM

## 2019-10-10 ENCOUNTER — Other Ambulatory Visit: Payer: Self-pay

## 2019-10-10 ENCOUNTER — Ambulatory Visit (INDEPENDENT_AMBULATORY_CARE_PROVIDER_SITE_OTHER): Payer: Self-pay | Admitting: Physician Assistant

## 2019-10-10 VITALS — BP 166/87 | HR 83 | Ht 64.5 in | Wt 220.0 lb

## 2019-10-10 DIAGNOSIS — E782 Mixed hyperlipidemia: Secondary | ICD-10-CM

## 2019-10-10 DIAGNOSIS — B3731 Acute candidiasis of vulva and vagina: Secondary | ICD-10-CM

## 2019-10-10 DIAGNOSIS — R350 Frequency of micturition: Secondary | ICD-10-CM

## 2019-10-10 DIAGNOSIS — F419 Anxiety disorder, unspecified: Secondary | ICD-10-CM

## 2019-10-10 DIAGNOSIS — B373 Candidiasis of vulva and vagina: Secondary | ICD-10-CM

## 2019-10-10 DIAGNOSIS — E1165 Type 2 diabetes mellitus with hyperglycemia: Secondary | ICD-10-CM

## 2019-10-10 DIAGNOSIS — K219 Gastro-esophageal reflux disease without esophagitis: Secondary | ICD-10-CM

## 2019-10-10 DIAGNOSIS — I10 Essential (primary) hypertension: Secondary | ICD-10-CM

## 2019-10-10 DIAGNOSIS — G8929 Other chronic pain: Secondary | ICD-10-CM

## 2019-10-10 DIAGNOSIS — G4700 Insomnia, unspecified: Secondary | ICD-10-CM

## 2019-10-10 DIAGNOSIS — F339 Major depressive disorder, recurrent, unspecified: Secondary | ICD-10-CM

## 2019-10-10 DIAGNOSIS — M48061 Spinal stenosis, lumbar region without neurogenic claudication: Secondary | ICD-10-CM

## 2019-10-10 DIAGNOSIS — Z6837 Body mass index (BMI) 37.0-37.9, adult: Secondary | ICD-10-CM

## 2019-10-10 DIAGNOSIS — M5442 Lumbago with sciatica, left side: Secondary | ICD-10-CM

## 2019-10-10 DIAGNOSIS — R300 Dysuria: Secondary | ICD-10-CM

## 2019-10-10 LAB — POCT URINALYSIS DIP (CLINITEK)
Bilirubin, UA: NEGATIVE
Blood, UA: NEGATIVE
Glucose, UA: >=1000 mg/dL — AB
Ketones, POC UA: NEGATIVE mg/dL
Nitrite, UA: NEGATIVE
POC PROTEIN,UA: NEGATIVE
Spec Grav, UA: 1.02 (ref 1.010–1.025)
Urobilinogen, UA: 0.2 E.U./dL
pH, UA: 5.5 (ref 5.0–8.0)

## 2019-10-10 LAB — POCT GLYCOSYLATED HEMOGLOBIN (HGB A1C): Hemoglobin A1C: 10.4 % — AB (ref 4.0–5.6)

## 2019-10-10 LAB — POCT UA - MICROALBUMIN
Albumin/Creatinine Ratio, Urine, POC: <30
Creatinine, POC: 200 mg/dL
Microalbumin Ur, POC: 30 mg/L

## 2019-10-10 MED ORDER — METFORMIN HCL ER 500 MG PO TB24: 500.0000 mg | ORAL_TABLET | Freq: Two times a day (BID) | ORAL | 2 refills | Status: DC

## 2019-10-10 MED ORDER — FLUCONAZOLE 150 MG PO TABS: ORAL_TABLET | ORAL | 0 refills | Status: DC

## 2019-10-10 MED ORDER — BUSPIRONE HCL 7.5 MG PO TABS: 7.5000 mg | ORAL_TABLET | Freq: Two times a day (BID) | ORAL | 2 refills | Status: DC

## 2019-10-10 MED ORDER — PIOGLITAZONE HCL 30 MG PO TABS: 30.0000 mg | ORAL_TABLET | Freq: Every day | ORAL | 2 refills | Status: DC

## 2019-10-10 MED ORDER — MIRTAZAPINE 45 MG PO TABS: 45.0000 mg | ORAL_TABLET | Freq: Every day | ORAL | 2 refills | Status: DC

## 2019-10-10 MED ORDER — VENLAFAXINE HCL ER 150 MG PO CP24: 150.0000 mg | ORAL_CAPSULE | Freq: Every day | ORAL | 2 refills | Status: DC

## 2019-10-10 NOTE — Patient Instructions (Signed)
Start buspar twice a day.  Increase remeron.  Continue effexor.  Start actos. Increased metformin.  Follow up in 3 months.

## 2019-10-10 NOTE — Progress Notes (Signed)
Subjective:    Patient ID: Samantha [name], female    DOB: 1965/12/06, 54 y.o.   MRN: [id number]  HPI  Pt is a 54 yo obese female with T2DM, chronic low back pain to the left with spinal steonsis, hx of CV, HTN, HLD, Depression who presents to the clinic for follow up.   She admits she is not being compliant with medications. She admits she has no motivation and depressed. She lost her job in December. She does not have insurance. She is eating more desserts than she should. She is not always taking medication like she should. She is not exercising. She is on NPH insulin becaue affordable. She is not sleeping. Her chronic pain in left low back is worsening. Her last injection was last fall and helped but wearing off. She is urinating a lot. No fever, chills, flank pain. She reports vaginal itching like yeast infection.   .. Active Ambulatory Problems    Diagnosis Date Noted  . Type II diabetes mellitus (HCC) 11/22/2018  . Seizure as late effect of cerebrovascular accident (CVA) (HCC) 11/22/2018  . Cerebrovascular accident (CVA) due to occlusion of precerebral artery (HCC) 11/22/2018  . Essential hypertension 11/22/2018  . Vitamin D deficiency 11/22/2018  . Chronic left-sided low back pain with left-sided sciatica 11/26/2018  . Class 2 severe obesity due to excess calories with serious comorbidity and body mass index (BMI) of 37.0 to 37.9 in adult [facility]) 11/26/2018  . Mixed hyperlipidemia 11/26/2018  . B12 deficiency anemia 12/01/2018  . Chronic fatigue 12/01/2018  . GERD (gastroesophageal reflux disease) 12/01/2018  . Dysthymia 12/01/2018  . DDD (degenerative disc disease), lumbar 02/06/2019  . Spinal stenosis at L4-L5 level 02/09/2019  . Anterolisthesis 02/09/2019  . Depression, recurrent (HCC) 10/10/2019  . Insomnia 10/17/2019  . Urinary frequency 10/17/2019   Resolved Ambulatory Problems    Diagnosis Date Noted  . Prediabetes 12/01/2018   Past Medical History:  Diagnosis  Date  . Anxiety   . Depression   . Diabetes mellitus without complication (HCC)   . Hypertension   . Mitral valve prolapse   . Seizures (HCC)   . Stroke [facility])       Review of Systems See HPI.     Objective:   Physical Exam Vitals reviewed.  Constitutional:      Appearance: Normal appearance. She is obese.  HENT:     Head: Normocephalic.  Cardiovascular:     Rate and Rhythm: Normal rate and regular rhythm.  Pulmonary:     Effort: Pulmonary effort is normal.  Abdominal:     General: Bowel sounds are normal. There is no distension.     Palpations: Abdomen is soft.     Tenderness: There is no abdominal tenderness. There is no right CVA tenderness or left CVA tenderness.  Neurological:     General: No focal deficit present.     Mental Status: She is alert and oriented to person, place, and time.  Psychiatric:        Mood and Affect: Mood normal.           Assessment & Plan:  Samantha KitchenMarland KitchenPaylin was seen today for follow-up.  Diagnoses and all orders for this visit:  Type 2 diabetes mellitus with hyperglycemia, without long-term current use of insulin (HCC) -     POCT glycosylated hemoglobin (Hb A1C) -     POCT UA - Microalbumin -     pioglitazone (ACTOS) 30 MG tablet; Take 1 tablet (30 mg total)  by mouth daily. -     metFORMIN (GLUCOPHAGE XR) 500 MG 24 hr tablet; Take 1 tablet (500 mg total) by mouth 2 (two) times daily.  Dysuria -     POCT URINALYSIS DIP (CLINITEK) -     Urine Culture  Yeast infection of the vagina -     fluconazole (DIFLUCAN) 150 MG tablet; Take one tablet as needed for yeast infection symptoms repeat in 48-72 hours if symptoms persist.  Depression, recurrent (HCC) -     mirtazapine (REMERON) 45 MG tablet; Take 1 tablet (45 mg total) by mouth at bedtime. -     venlafaxine XR (EFFEXOR-XR) 150 MG 24 hr capsule; Take 1 capsule (150 mg total) by mouth daily with breakfast.  Essential hypertension  Gastroesophageal reflux disease, unspecified whether  esophagitis present  Chronic left-sided low back pain with left-sided sciatica  Class 2 severe obesity due to excess calories with serious comorbidity and body mass index (BMI) of 37.0 to 37.9 in adult [facility])  Mixed hyperlipidemia  Spinal stenosis at L4-L5 level  Insomnia, unspecified type -     mirtazapine (REMERON) 45 MG tablet; Take 1 tablet (45 mg total) by mouth at bedtime.  Urinary frequency  Anxiety -     busPIRone (BUSPAR) 7.5 MG tablet; Take 1 tablet (7.5 mg total) by mouth 2 (two) times daily.  .. Results for orders placed or performed in visit on 10/10/19  Urine Culture   Specimen: Urine  Result Value Ref Range   MICRO NUMBER: [id number]    SPECIMEN QUALITY: Adequate    Sample Source URINE    STATUS: FINAL    ISOLATE 1: Escherichia coli (A)       Susceptibility   Escherichia coli - URINE CULTURE, REFLEX    AMOX/CLAVULANIC 8 Sensitive     AMPICILLIN 16 Intermediate     AMPICILLIN/SULBACTAM 4 Sensitive     CEFAZOLIN* <=4 Not Reportable      * For infections other than uncomplicated UTIcaused by E. coli, K. pneumoniae or P. mirabilis:Cefazolin is resistant if MIC > or = 8 mcg/mL.(Distinguishing susceptible versus intermediatefor isolates with MIC < or = 4 mcg/mL requiresadditional testing.)For uncomplicated UTI caused by E. coli,K. pneumoniae or P. mirabilis: Cefazolin issusceptible if MIC <32 mcg/mL and predictssusceptible to the oral agents cefaclor, cefdinir,cefpodoxime, cefprozil, cefuroxime, cephalexinand loracarbef.    CEFEPIME <=1 Sensitive     CEFTRIAXONE <=1 Sensitive     CIPROFLOXACIN <=0.25 Sensitive     LEVOFLOXACIN <=0.12 Sensitive     ERTAPENEM <=0.5 Sensitive     GENTAMICIN <=1 Sensitive     IMIPENEM <=0.25 Sensitive     NITROFURANTOIN <=16 Sensitive     PIP/TAZO <=4 Sensitive     TOBRAMYCIN <=1 Sensitive     TRIMETH/SULFA* <=20 Sensitive      * For infections other than uncomplicated UTIcaused by E. coli, K. pneumoniae or P. mirabilis:Cefazolin is  resistant if MIC > or = 8 mcg/mL.(Distinguishing susceptible versus intermediatefor isolates with MIC < or = 4 mcg/mL requiresadditional testing.)For uncomplicated UTI caused by E. coli,K. pneumoniae or P. mirabilis: Cefazolin issusceptible if MIC <32 mcg/mL and predictssusceptible to the oral agents cefaclor, cefdinir,cefpodoxime, cefprozil, cefuroxime, cephalexinand loracarbef.Legend:S = Susceptible  I = IntermediateR = Resistant  NS = Not susceptible* = Not tested  NR = Not reported**NN = See antimicrobic comments  POCT URINALYSIS DIP (CLINITEK)  Result Value Ref Range   Color, UA yellow yellow   Clarity, UA clear clear   Glucose, UA >=1,000 (A) negative mg/dL  Bilirubin, UA negative negative   Ketones, POC UA negative negative mg/dL   Spec Grav, UA 1.020 1.010 - 1.025   Blood, UA negative negative   pH, UA 5.5 5.0 - 8.0   POC PROTEIN,UA negative negative, trace   Urobilinogen, UA 0.2 0.2 or 1.0 E.U./dL   Nitrite, UA Negative Negative   Leukocytes, UA Trace (A) Negative  POCT glycosylated hemoglobin (Hb A1C)  Result Value Ref Range   Hemoglobin A1C 10.4 (A) 4.0 - 5.6 %   HbA1c POC (<> result, manual entry)     HbA1c, POC (prediabetic range)     HbA1c, POC (controlled diabetic range)    POCT UA - Microalbumin  Result Value Ref Range   Microalbumin Ur, POC 30 mg/L   Creatinine, POC 200 mg/dL   Albumin/Creatinine Ratio, Urine, POC <30    UA in office inconclusive for infection. Sent for culture and positive for e.coli. sent macrobid.  Sent diflucan for yeast. Sent extra tablets but discussed if she continues to urinate off that much sugar she could have problems with this.   A1C terrible.  She does not have insurance. Not afford GLP1 or GLT2. Stay on metformin.  Not spilling protein.  BP is NOT controlled. Pt not taking medication.  On lipitor.  Added actos. Discussed side effects and watch for swelling.  Increase NPH insulin 45 units bid.   Likely not sleeping due to  depression, pain, diabetes out of control. Increased remeron to 45mg  at bedtime. Discussed good sleep routine. Work on above plan. Take medications regularly. Continue effexor. Added buspar. Likely adding abilify would help but concerned about weight gain.   Injections did well for low back pain but wearing off and no insurance. Keep doing home exercises. Walk daily. Discussed tens, icy hot, massage.   Continues to smoke. Pt is a very high stroke risk. Discussed risk if she does not start taking this seriously and be more compliant.   Follow up in 3 months.   Spent 42 minutes in patient care.

## 2019-10-12 ENCOUNTER — Other Ambulatory Visit: Payer: Self-pay | Admitting: Physician Assistant

## 2019-10-12 LAB — URINE CULTURE
MICRO NUMBER:: 10127777
SPECIMEN QUALITY:: ADEQUATE

## 2019-10-12 MED ORDER — NITROFURANTOIN MONOHYD MACRO 100 MG PO CAPS: 100.0000 mg | ORAL_CAPSULE | Freq: Two times a day (BID) | ORAL | 0 refills | Status: DC

## 2019-10-12 NOTE — Progress Notes (Signed)
Call pt:   Urine culture shows infection with E.coli. sent macrobid for 7 days to pharmacy.

## 2019-10-13 ENCOUNTER — Telehealth: Payer: Self-pay

## 2019-10-13 DIAGNOSIS — E559 Vitamin D deficiency, unspecified: Secondary | ICD-10-CM

## 2019-10-13 NOTE — Telephone Encounter (Signed)
Pt called re: vitamin D refill. Pt states she was told a refill would be sent in at her 10/10/2019 OV but it was not sent in. I do not see where vit d labs have been done and wanted clarification regarding refill.

## 2019-10-14 MED ORDER — VITAMIN D (ERGOCALCIFEROL) 1.25 MG (50000 UNIT) PO CAPS: 50000.0000 [IU] | ORAL_CAPSULE | ORAL | 1 refills | Status: DC

## 2019-10-14 NOTE — Telephone Encounter (Signed)
I sent. She does not have insurance right now and cannot get labs.

## 2019-10-14 NOTE — Telephone Encounter (Signed)
Patient aware of instructions/recommendations via voicemail. Instructed patient to call back if there are any questions or concerns.   

## 2019-10-17 ENCOUNTER — Encounter: Payer: Self-pay | Admitting: Physician Assistant

## 2019-10-17 DIAGNOSIS — R350 Frequency of micturition: Secondary | ICD-10-CM | POA: Insufficient documentation

## 2019-10-17 DIAGNOSIS — G4700 Insomnia, unspecified: Secondary | ICD-10-CM | POA: Insufficient documentation

## 2019-10-17 MED ORDER — AMLODIPINE BESYLATE 10 MG PO TABS: 10.0000 mg | ORAL_TABLET | Freq: Every day | ORAL | 0 refills | Status: DC

## 2019-11-05 ENCOUNTER — Ambulatory Visit: Payer: Self-pay | Attending: Internal Medicine

## 2019-11-05 DIAGNOSIS — Z23 Encounter for immunization: Secondary | ICD-10-CM | POA: Insufficient documentation

## 2019-11-05 NOTE — Progress Notes (Signed)
   Covid-19 Vaccination Clinic  Name:  Samantha [name]    MRN: [id number] DOB: 11/20/1965  11/05/2019  Samantha [name] was observed post Covid-19 immunization for 15 minutes without incident. She was provided with Vaccine Information Sheet and instruction to access the V-Safe system.   Samantha [name] was instructed to call 911 with any severe reactions post vaccine: Marland Kitchen Difficulty breathing  . Swelling of face and throat  . A fast heartbeat  . A bad rash all over body  . Dizziness and weakness   Immunizations Administered    Name Date Dose VIS Date Route   Moderna COVID-19 Vaccine 11/05/2019 11:41 AM 0.5 mL 08/02/2019 Intramuscular   Manufacturer: Moderna   Lot: [id number]   NDC: 24497-530-05

## 2019-11-14 ENCOUNTER — Encounter: Payer: Self-pay | Admitting: Physician Assistant

## 2019-11-14 NOTE — Telephone Encounter (Signed)
I do not order epidural injections. Sports medicine providers do. She saw Dr. Denyse Amass last in June. He would probably want to see her again or needs to establish with Dr. Karie Schwalbe. Call and give her options. See Dr. Denyse Amass or establish with Dr.T.   Bonita Quin could routine to [facility] to see if he would order first and have her follow up after?

## 2019-11-15 ENCOUNTER — Encounter: Payer: Self-pay | Admitting: Family Medicine

## 2019-11-15 DIAGNOSIS — G8929 Other chronic pain: Secondary | ICD-10-CM

## 2019-11-15 DIAGNOSIS — M48061 Spinal stenosis, lumbar region without neurogenic claudication: Secondary | ICD-10-CM

## 2019-11-17 ENCOUNTER — Ambulatory Visit
Admission: RE | Admit: 2019-11-17 | Discharge: 2019-11-17 | Disposition: A | Discharge status: Home / Self Care | Payer: No Typology Code available for payment source | Source: Ambulatory Visit | Attending: Family Medicine | Admitting: Family Medicine

## 2019-11-17 ENCOUNTER — Other Ambulatory Visit: Payer: Self-pay

## 2019-11-17 DIAGNOSIS — G8929 Other chronic pain: Secondary | ICD-10-CM

## 2019-11-17 DIAGNOSIS — M48061 Spinal stenosis, lumbar region without neurogenic claudication: Secondary | ICD-10-CM

## 2019-11-17 MED ORDER — IOPAMIDOL (ISOVUE-M 200) INJECTION 41%
1.0000 mL | Freq: Once | INTRAMUSCULAR | Status: AC
  Administered 2019-11-17: 1 mL via EPIDURAL

## 2019-11-17 MED ORDER — METHYLPREDNISOLONE ACETATE 40 MG/ML INJ SUSP (RADIOLOG
120.0000 mg | Freq: Once | INTRAMUSCULAR | Status: AC
  Administered 2019-11-17: 120 mg via EPIDURAL

## 2019-11-17 NOTE — Discharge Instructions (Signed)

## 2019-12-07 ENCOUNTER — Ambulatory Visit: Payer: Self-pay | Attending: Internal Medicine

## 2020-01-06 ENCOUNTER — Ambulatory Visit: Payer: Self-pay | Admitting: Physician Assistant

## 2020-01-14 ENCOUNTER — Other Ambulatory Visit: Payer: Self-pay | Admitting: Physician Assistant

## 2020-01-14 DIAGNOSIS — F339 Major depressive disorder, recurrent, unspecified: Secondary | ICD-10-CM

## 2020-01-14 DIAGNOSIS — E1165 Type 2 diabetes mellitus with hyperglycemia: Secondary | ICD-10-CM

## 2020-01-18 ENCOUNTER — Other Ambulatory Visit: Payer: Self-pay

## 2020-01-18 DIAGNOSIS — I69398 Other sequelae of cerebral infarction: Secondary | ICD-10-CM

## 2020-01-18 MED ORDER — LEVETIRACETAM 500 MG PO TABS: 500.0000 mg | ORAL_TABLET | Freq: Two times a day (BID) | ORAL | 0 refills | Status: DC

## 2020-01-23 ENCOUNTER — Encounter: Payer: Self-pay | Admitting: Family Medicine

## 2020-01-23 ENCOUNTER — Ambulatory Visit (INDEPENDENT_AMBULATORY_CARE_PROVIDER_SITE_OTHER): Payer: Self-pay | Admitting: Family Medicine

## 2020-01-23 ENCOUNTER — Other Ambulatory Visit: Payer: Self-pay

## 2020-01-23 VITALS — BP 133/67 | HR 75 | Ht 65.0 in | Wt 233.0 lb

## 2020-01-23 DIAGNOSIS — R569 Unspecified convulsions: Secondary | ICD-10-CM

## 2020-01-23 DIAGNOSIS — G8929 Other chronic pain: Secondary | ICD-10-CM

## 2020-01-23 DIAGNOSIS — E1165 Type 2 diabetes mellitus with hyperglycemia: Secondary | ICD-10-CM

## 2020-01-23 DIAGNOSIS — F339 Major depressive disorder, recurrent, unspecified: Secondary | ICD-10-CM

## 2020-01-23 DIAGNOSIS — M5442 Lumbago with sciatica, left side: Secondary | ICD-10-CM

## 2020-01-23 DIAGNOSIS — I69398 Other sequelae of cerebral infarction: Secondary | ICD-10-CM

## 2020-01-23 DIAGNOSIS — F341 Dysthymic disorder: Secondary | ICD-10-CM

## 2020-01-23 LAB — POCT GLYCOSYLATED HEMOGLOBIN (HGB A1C): Hemoglobin A1C: 7.3 % — AB (ref 4.0–5.6)

## 2020-01-23 MED ORDER — BUPROPION HCL ER (XL) 150 MG PO TB24: 150.0000 mg | ORAL_TABLET | ORAL | 2 refills | Status: DC

## 2020-01-23 NOTE — Assessment & Plan Note (Signed)
Discussed options we will discontinue BuSpar since it is really not been helpful.  Currently on Effexor 150 mg we discussed the possibility of actually increasing her dose a little bit more and or maybe adding Wellbutrin which could help with focus and staying on task.  She is open to black trying the Wellbutrin for 30 days to see if she is like it is helpful.

## 2020-01-23 NOTE — Assessment & Plan Note (Signed)
A1c looks much better today A1c is down to 7.3 it was previously 10.4 back in February so significant strides.  Ultimately of course we will get the A1c under 7 if possible.

## 2020-01-23 NOTE — Assessment & Plan Note (Signed)
She also reports that her low back radiating down into that left hip has actually been getting worse and more bothersome.  She was previously getting injections but they have only been lasting 2 to 3 months at this point.  She would like referral to an orthopedist or neurosurgeon to discuss more definitive treatment options was previously seeing Dr. Denyse Amass, sports medicine.

## 2020-01-23 NOTE — Assessment & Plan Note (Signed)
History of seizure disorder.  She is actually only taking her Keppra once a day instead of twice a day.

## 2020-01-23 NOTE — Progress Notes (Signed)
Established Patient Office Visit  Subjective:  Patient ID: Samantha [name], female    DOB: 1966-02-25  Age: 54 y.o. MRN: [id number]  CC:  Chief Complaint  Patient presents with  . Diabetes    HPI Samantha [name] presents for   Diabetes - no hypoglycemic events. No wounds or sores that are not healing well. No increased thirst or urination. Checking glucose at home. Taking medications as prescribed without any side effects. Not on Novoin N and says the Actos is working well. Still on metformin.    F/U SEizure d/o - ON Kepra one tab daily.   F/U Depression Brown Human -currently on Effexor XR 150 mg daily.  Still reports significant anxiety symptoms with a GAD-7 score of 11.  PHQ-9 score is 4.  Says she still just really struggling with feeling anxious and also staying really focused on top of things.  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  She says she never really gets a break she is always on call even last week she went to visit her son out of state and was getting phone calls from work.  She says sometimes she just feels almost overwhelmed that she cannot get things organized in her brain.  She has been on the BuSpar and has tried the 7.5 mg, which she recently increased.  She says she really has not noticed any benefit at all she does not think it is been helpful.  Reports that her low back is actually getting worse was previously seeing Dr. Georgina Snell.  Even pushing the med cart at work really exacerbates her pain.  She is only getting 2 to 3 months of relief with injections.  Past Medical History:  Diagnosis Date  . Anxiety   . Depression   . Diabetes mellitus without complication ([place])   . Hypertension   . Mitral valve prolapse   . Seizures ([place])   .  Stroke [facility])     Past Surgical History:  Procedure Laterality Date  . ABDOMINAL HYSTERECTOMY    . CHOLECYSTECTOMY      Family History  Problem Relation Age of Onset  . Hypertension Father   . Heart attack Father   . Stroke Father   . Skin cancer Father   . Breast cancer Paternal Grandmother   . Heart attack Paternal Grandfather     Social History   Socioeconomic History  . Marital status: Divorced    Spouse name: Not on file  . Number of children: Not on file  . Years of education: Not on file  . Highest education level: Not on file  Occupational History  . Not on file  Tobacco Use  . Smoking status: Current Every Day Smoker    Packs/day: 1.00    Years: 25.00    Pack years: 25.00  . Smokeless tobacco: Never Used  Substance and Sexual Activity  . Alcohol use: Yes    Comment: socail  . Drug use: Never  . Sexual activity: Not Currently    Partners: Male    Birth control/protection: Condom  Other Topics Concern  . Not on file  Social History Narrative  . Not on file   Social Determinants of Health   Financial Resource Strain:   . Difficulty of  Paying Living Expenses:   Food Insecurity:   . Worried About Charity fundraiser in the Last Year:   . Arboriculturist in the Last Year:   Transportation Needs:   . Film/video editor (Medical):   Marland Kitchen Lack of Transportation (Non-Medical):   Physical Activity:   . Days of Exercise per Week:   . Minutes of Exercise per Session:   Stress:   . Feeling of Stress :   Social Connections:   . Frequency of Communication with Friends and Family:   . Frequency of Social Gatherings with Friends and Family:   . Attends Religious Services:   . Active Member of Clubs or Organizations:   . Attends Archivist Meetings:   Marland Kitchen Marital Status:   Intimate Partner Violence:   . Fear of Current or Ex-Partner:   . Emotionally Abused:   Marland Kitchen Physically Abused:   . Sexually Abused:     Outpatient Medications Prior to Visit   Medication Sig Dispense Refill  . amLODipine (NORVASC) 10 MG tablet Take 1 tablet (10 mg total) by mouth daily. 90 tablet 0  . aspirin EC 81 MG tablet Take 81 mg by mouth daily.    Marland Kitchen atorvastatin (LIPITOR) 40 MG tablet Take 1 tablet (40 mg total) by mouth daily. (Patient taking differently: Take 40 mg by mouth. 1 tablet every 2-3 days) 30 tablet 5  . gabapentin (NEURONTIN) 300 MG capsule One tab PO qHS for a week, then BID for a week, then TID. May double weekly to a max of 3,'600mg'$ /day 180 capsule 3  . insulin NPH Human (NOVOLIN N RELION) 100 UNIT/ML injection Take 15 units of insulin in the morning before breakfast and before bed. Increase by 2 units every 2 days until fasting glucose is 90-130 and evening before bed glucose 120-180. (Patient taking differently: Inject 38 Units into the skin 2 (two) times daily before a meal. Take 15 units of insulin in the morning before breakfast and before bed. Increase by 2 units every 2 days until fasting glucose is 90-130 and evening before bed glucose 120-180.) 10 mL 1  . INSULIN SYRINGE 1CC/29G 29G X 1/2" 1 ML MISC Use with novolin vials twice a day. 100 each 0  . levETIRAcetam (KEPPRA) 500 MG tablet Take 1 tablet (500 mg total) by mouth 2 (two) times daily. (Patient taking differently: Take 500 mg by mouth daily. ) 60 tablet 0  . metFORMIN (GLUCOPHAGE XR) 500 MG 24 hr tablet Take 1 tablet (500 mg total) by mouth 2 (two) times daily. 60 tablet 2  . pioglitazone (ACTOS) 30 MG tablet Take 1 tablet by mouth once daily 30 tablet 0  . venlafaxine XR (EFFEXOR-XR) 150 MG 24 hr capsule TAKE 1 CAPSULE BY MOUTH ONCE DAILY WITH BREAKFAST 30 capsule 0  . Vitamin D, Ergocalciferol, (DRISDOL) 1.25 MG (50000 UNIT) CAPS capsule Take 1 capsule (50,000 Units total) by mouth once a week. 12 capsule 1  . busPIRone (BUSPAR) 7.5 MG tablet Take 1 tablet (7.5 mg total) by mouth 2 (two) times daily. 60 tablet 2  . fluconazole (DIFLUCAN) 150 MG tablet Take one tablet as needed for  yeast infection symptoms repeat in 48-72 hours if symptoms persist. 4 tablet 0  . metFORMIN (GLUCOPHAGE-XR) 500 MG 24 hr tablet Take 1 tablet (500 mg total) by mouth daily with breakfast. Needs labs 90 tablet 0  . mirtazapine (REMERON) 45 MG tablet Take 1 tablet (45 mg total) by mouth at bedtime. [place]  tablet 2  . nitrofurantoin, macrocrystal-monohydrate, (MACROBID) 100 MG capsule Take 1 capsule (100 mg total) by mouth 2 (two) times daily. 14 capsule 0  . oxyCODONE-acetaminophen (PERCOCET) 10-325 MG tablet Take 1 tablet by mouth every 12 (twelve) hours as needed for pain. 60 tablet 0   No facility-administered medications prior to visit.    Allergies  Allergen Reactions  . Ace Inhibitors Swelling  . Beta Adrenergic Blockers Other (See Comments)    Low heart rate  . Glipizide     nausea    ROS Review of Systems    Objective:    Physical Exam  Constitutional: She is oriented to person, place, and time. She appears well-developed and well-nourished.  HENT:  Head: Normocephalic and atraumatic.  Cardiovascular: Normal rate, regular rhythm and normal heart sounds.  Pulmonary/Chest: Effort normal and breath sounds normal.  Neurological: She is alert and oriented to person, place, and time.  Skin: Skin is warm and dry.  Psychiatric: She has a normal mood and affect. Her behavior is normal.    BP 133/67   Pulse 75   Ht _0  (1.651 m)   Wt 233 lb (105.7 kg)   LMP  (LMP Unknown)   SpO2 98%   BMI 38.77 kg/m  Wt Readings from Last 3 Encounters:  01/23/20 233 lb (105.7 kg)  10/10/19 220 lb (99.8 kg)  02/08/19 225 lb (102.1 kg)     There are no preventive care reminders to display for this patient.  There are no preventive care reminders to display for this patient.  No results found for: TSH No results found for: WBC, HGB, HCT, MCV, PLT No results found for: NA, K, CHLORIDE, CO2, GLUCOSE, BUN, CREATININE, BILITOT, ALKPHOS, AST, ALT, PROT, ALBUMIN, CALCIUM, ANIONGAP, EGFR,  GFR No results found for: CHOL No results found for: HDL No results found for: LDLCALC No results found for: TRIG No results found for: CHOLHDL Lab Results  Component Value Date   HGBA1C 7.3 (A) 01/23/2020      Assessment & Plan:   Problem List Items Addressed This Visit      Endocrine   Type II diabetes mellitus ([place]) - Primary    A1c looks much better today A1c is down to 7.3 it was previously 10.4 back in February so significant strides.  Ultimately of course we will get the A1c under 7 if possible.      Relevant Orders   POCT glycosylated hemoglobin (Hb A1C) (Completed)     Nervous and Auditory   Seizure as late effect of cerebrovascular accident (CVA) ([place])    History of seizure disorder.  She is actually only taking her Keppra once a day instead of twice a day.      Chronic left-sided low back pain with left-sided sciatica    She also reports that her low back radiating down into that left hip has actually been getting worse and more bothersome.  She was previously getting injections but they have only been lasting 2 to 3 months at this point.  She would like referral to an orthopedist or neurosurgeon to discuss more definitive treatment options was previously seeing Dr. Georgina Snell, sports medicine.      Relevant Medications   buPROPion (WELLBUTRIN XL) 150 MG 24 hr tablet   Other Relevant Orders   Ambulatory referral to Neurosurgery     Other   Dysthymia   Relevant Medications   buPROPion (WELLBUTRIN XL) 150 MG 24 hr tablet   Depression, recurrent ([place])  Discussed options we will discontinue BuSpar since it is really not been helpful.  Currently on Effexor 150 mg we discussed the possibility of actually increasing her dose a little bit more and or maybe adding Wellbutrin which could help with focus and staying on task.  She is open to black trying the Wellbutrin for 30 days to see if she is like it is helpful.      Relevant Medications   buPROPion (WELLBUTRIN XL) 150  MG 24 hr tablet      Meds ordered this encounter  Medications  . buPROPion (WELLBUTRIN XL) 150 MG 24 hr tablet    Sig: Take 1 tablet (150 mg total) by mouth every morning.    Dispense:  30 tablet    Refill:  2    Follow-up: Return in about 3 months (around 04/24/2020) for Diabetes follow-up.    Beatrice Lecher, MD

## 2020-02-17 ENCOUNTER — Encounter: Payer: Self-pay | Admitting: Family Medicine

## 2020-02-17 MED ORDER — OXYCODONE-ACETAMINOPHEN 5-325 MG PO TABS: 1.0000 | ORAL_TABLET | Freq: Four times a day (QID) | ORAL | 0 refills | Status: DC | PRN

## 2020-02-17 NOTE — Telephone Encounter (Signed)
Sent percocet 5. Encourage her to also f/u with Dr. Denyse Amass in [facility]

## 2020-02-18 ENCOUNTER — Other Ambulatory Visit: Payer: Self-pay | Admitting: Physician Assistant

## 2020-02-18 DIAGNOSIS — E1165 Type 2 diabetes mellitus with hyperglycemia: Secondary | ICD-10-CM

## 2020-02-18 DIAGNOSIS — I10 Essential (primary) hypertension: Secondary | ICD-10-CM

## 2020-02-18 DIAGNOSIS — F339 Major depressive disorder, recurrent, unspecified: Secondary | ICD-10-CM

## 2020-02-20 MED ORDER — OXYCODONE-ACETAMINOPHEN 5-325 MG PO TABS: 1.0000 | ORAL_TABLET | Freq: Four times a day (QID) | ORAL | 0 refills | Status: AC | PRN

## 2020-02-20 NOTE — Telephone Encounter (Signed)
RX cancelled at [facility] by CPhT.   RX pended for [facility] in [place]

## 2020-03-16 ENCOUNTER — Other Ambulatory Visit: Payer: Self-pay | Admitting: Neurosurgery

## 2020-03-25 NOTE — Progress Notes (Signed)
Your procedure is scheduled on Wednesday, July 28th.  Report to [facility] Main Entrance "A" at 8:00 A.M., and check in at the Admitting office.  Call this number if you have problems the morning of surgery:  [phone]  Call [phone] if you have any questions prior to your surgery date Monday-Friday 8am-4pm   Remember:  Do not eat or drink after midnight the night before your surgery    Take these medicines the morning of surgery with A SIP OF WATER  amLODipine (NORVASC)  buPROPion (WELLBUTRIN XL) levETIRAcetam (KEPPRA)  venlafaxine XR (EFFEXOR-XR)   Follow your surgeon's instructions on when to stop Aspirin.  If no instructions were given by your surgeon then you will need to call the office to get those instructions.    As of today, STOP taking any Aleve, Naproxen, Ibuprofen, Motrin, Advil, Goody's, BC's, all herbal medications, fish oil, and all vitamins.          WHAT DO I DO ABOUT MY DIABETES MEDICATION?  ----The morning of surgery--- Do NOT take pioglitazone (ACTOS)  HOW TO MANAGE YOUR DIABETES BEFORE AND AFTER SURGERY  Why is it important to control my blood sugar before and after surgery? . Improving blood sugar levels before and after surgery helps healing and can limit problems. . A way of improving blood sugar control is eating a healthy diet by: o  Eating less sugar and carbohydrates o  Increasing activity/exercise o  Talking with your doctor about reaching your blood sugar goals . High blood sugars (greater than 180 mg/dL) can raise your risk of infections and slow your recovery, so you will need to focus on controlling your diabetes during the weeks before surgery. . Make sure that the doctor who takes care of your diabetes knows about your planned surgery including the date and location.  How do I manage my blood sugar before surgery? . Check your blood sugar at least 4 times a day, starting 2 days before surgery, to make sure that the level is not too  high or low. . Check your blood sugar the morning of your surgery when you wake up and every 2 hours until you get to the Short Stay unit. o If your blood sugar is less than 70 mg/dL, you will need to treat for low blood sugar: - Do not take insulin. - Treat a low blood sugar (less than 70 mg/dL) with  cup of clear juice (cranberry or apple), 4 glucose tablets, OR glucose gel. - Recheck blood sugar in 15 minutes after treatment (to make sure it is greater than 70 mg/dL). If your blood sugar is not greater than 70 mg/dL on recheck, call [phone] for further instructions. . Report your blood sugar to the short stay nurse when you get to Short Stay.  . If you are admitted to the hospital after surgery: o Your blood sugar will be checked by the staff and you will probably be given insulin after surgery (instead of oral diabetes medicines) to make sure you have good blood sugar levels. o The goal for blood sugar control after surgery is 80-180 mg/dL.              Do not wear jewelry, make up, or nail polish            Do not wear lotions, powders, perfumes or deodorant.            Do not shave 48 hours prior to surgery.  Do not bring valuables to the hospital.            [facility] is not responsible for any belongings or valuables.  Do NOT Smoke (Tobacco/Vaping) or drink Alcohol 24 hours prior to your procedure If you use a CPAP at night, you may bring all equipment for your overnight stay.   Contacts, glasses, dentures or bridgework may not be worn into surgery.      For patients admitted to the hospital, discharge time will be determined by your treatment team.   Patients discharged the day of surgery will not be allowed to drive home, and someone needs to stay with them for 24 hours.  Special instructions:   [facility]- Preparing For Surgery  Before surgery, you can play an important role. Because skin is not sterile, your skin needs to be as free of germs as  possible. You can reduce the number of germs on your skin by washing with CHG (chlorahexidine gluconate) Soap before surgery.  CHG is an antiseptic cleaner which kills germs and bonds with the skin to continue killing germs even after washing.    Oral Hygiene is also important to reduce your risk of infection.  Remember - BRUSH YOUR TEETH THE MORNING OF SURGERY WITH YOUR REGULAR TOOTHPASTE  Please do not use if you have an allergy to CHG or antibacterial soaps. If your skin becomes reddened/irritated stop using the CHG.  Do not shave (including legs and underarms) for at least 48 hours prior to first CHG shower. It is OK to shave your face.  Please follow these instructions carefully.   1. Shower the NIGHT BEFORE SURGERY and the MORNING OF SURGERY with CHG Soap.   2. If you chose to wash your hair, wash your hair first as usual with your normal shampoo.  3. After you shampoo, rinse your hair and body thoroughly to remove the shampoo.  4. Use CHG as you would any other liquid soap. You can apply CHG directly to the skin and wash gently with a scrungie or a clean washcloth.   5. Apply the CHG Soap to your body ONLY FROM THE NECK DOWN.  Do not use on open wounds or open sores. Avoid contact with your eyes, ears, mouth and genitals (private parts). Wash Face and genitals (private parts)  with your normal soap.   6. Wash thoroughly, paying special attention to the area where your surgery will be performed.  7. Thoroughly rinse your body with warm water from the neck down.  8. DO NOT shower/wash with your normal soap after using and rinsing off the CHG Soap.  9. Pat yourself dry with a CLEAN TOWEL.  10. Wear CLEAN PAJAMAS to bed the night before surgery  11. Place CLEAN SHEETS on your bed the night of your first shower and DO NOT SLEEP WITH PETS.  Day of Surgery: Wear Clean/Comfortable clothing the morning of surgery Do not apply any deodorants/lotions.   Remember to brush your teeth WITH  YOUR REGULAR TOOTHPASTE.   Please read over the following fact sheets that you were given.

## 2020-03-26 ENCOUNTER — Encounter (HOSPITAL_COMMUNITY)
Admission: RE | Admit: 2020-03-26 | Discharge: 2020-03-26 | Disposition: A | Discharge status: Home / Self Care | Payer: Managed Care, Other (non HMO) | Source: Ambulatory Visit | Attending: Neurosurgery | Admitting: Neurosurgery

## 2020-03-26 ENCOUNTER — Other Ambulatory Visit (HOSPITAL_COMMUNITY)
Admission: RE | Admit: 2020-03-26 | Discharge: 2020-03-26 | Disposition: A | Discharge status: Home / Self Care | Payer: Managed Care, Other (non HMO) | Source: Ambulatory Visit | Attending: Neurosurgery | Admitting: Neurosurgery

## 2020-03-26 ENCOUNTER — Encounter (HOSPITAL_COMMUNITY): Payer: Self-pay

## 2020-03-26 ENCOUNTER — Other Ambulatory Visit: Payer: Self-pay

## 2020-03-26 DIAGNOSIS — I2510 Atherosclerotic heart disease of native coronary artery without angina pectoris: Secondary | ICD-10-CM | POA: Diagnosis not present

## 2020-03-26 DIAGNOSIS — R9431 Abnormal electrocardiogram [ECG] [EKG]: Secondary | ICD-10-CM | POA: Insufficient documentation

## 2020-03-26 DIAGNOSIS — I10 Essential (primary) hypertension: Secondary | ICD-10-CM | POA: Diagnosis not present

## 2020-03-26 DIAGNOSIS — Z20822 Contact with and (suspected) exposure to covid-19: Secondary | ICD-10-CM | POA: Diagnosis not present

## 2020-03-26 DIAGNOSIS — Z01810 Encounter for preprocedural cardiovascular examination: Secondary | ICD-10-CM | POA: Diagnosis not present

## 2020-03-26 DIAGNOSIS — Z01812 Encounter for preprocedural laboratory examination: Secondary | ICD-10-CM | POA: Insufficient documentation

## 2020-03-26 HISTORY — DX: Unspecified chronic bronchitis: J42

## 2020-03-26 HISTORY — DX: Gastro-esophageal reflux disease without esophagitis: K21.9

## 2020-03-26 LAB — TYPE AND SCREEN
ABO/RH(D): A NEG
Antibody Screen: NEGATIVE

## 2020-03-26 LAB — SARS CORONAVIRUS 2 (TAT 6-24 HRS): SARS Coronavirus 2: NEGATIVE

## 2020-03-26 LAB — BASIC METABOLIC PANEL
Anion gap: 12 (ref 5–15)
BUN: 10 mg/dL (ref 6–20)
CO2: 24 mmol/L (ref 22–32)
Calcium: 9.2 mg/dL (ref 8.9–10.3)
Chloride: 103 mmol/L (ref 98–111)
Creatinine, Ser: 0.62 mg/dL (ref 0.44–1.00)
GFR calc Af Amer: >60 mL/min (ref >60)
GFR calc non Af Amer: >60 mL/min (ref >60)
Glucose, Bld: 103 mg/dL — ABNORMAL HIGH (ref 70–99)
Potassium: 3.5 mmol/L (ref 3.5–5.1)
Sodium: 139 mmol/L (ref 135–145)

## 2020-03-26 LAB — CBC
HCT: 44.8 % (ref 36.0–46.0)
Hemoglobin: 14.8 g/dL (ref 12.0–15.0)
MCH: 30.3 pg (ref 26.0–34.0)
MCHC: 33 g/dL (ref 30.0–36.0)
MCV: 91.8 fL (ref 80.0–100.0)
Platelets: 203 10*3/uL (ref 150–400)
RBC: 4.88 MIL/uL (ref 3.87–5.11)
RDW: 12.6 % (ref 11.5–15.5)
WBC: 8.1 10*3/uL (ref 4.0–10.5)
nRBC: 0 % (ref 0.0–0.2)

## 2020-03-26 LAB — HEMOGLOBIN A1C
Hgb A1c MFr Bld: 7.2 % — ABNORMAL HIGH (ref 4.8–5.6)
Mean Plasma Glucose: 159.94 mg/dL

## 2020-03-26 LAB — SURGICAL PCR SCREEN
MRSA, PCR: NEGATIVE
Staphylococcus aureus: NEGATIVE

## 2020-03-26 LAB — GLUCOSE, CAPILLARY: Glucose-Capillary: 115 mg/dL — ABNORMAL HIGH (ref 70–99)

## 2020-03-26 NOTE — Progress Notes (Signed)
PCP - Tandy Gaw, PA Cardiologist - patient has not established a cardiologist since moving to [place].  Cardiologist in [place] was Dr. Edrick Kins at [facility]  PPM/ICD - n/a Device Orders -  Rep Notified -   Chest x-ray - n/a EKG - 03/26/2020 Stress Test - patient states she thinks she has a stress test done 3-4 years ago and things she had it done at [facility] but is not positive - requesting records from hospital, cardiologist and patient's previous PCP (Dr. Hassell Done) ECHO - Patient states her last echo was 3-4 years ago and thinks she had it done at [facility] but is not positive - requesting records from hospital, cardiologist and patient's previous PCP (Dr. Hassell Done) Cardiac Cath - Patient denies  Sleep Study - n/a CPAP -   Fasting Blood Sugar - around 100 Checks Blood Sugar - patient states she only checks her CBG when she is "feeling off"  Blood Thinner Instructions: n/a Aspirin Instructions: last dose was 03/24/2020  ERAS Protcol - n/a PRE-SURGERY Ensure or G2-   COVID TEST- after PAT appointment   Anesthesia review: yes, patient has history of MVP.  Requested echo and stress test results from previous PCP, cardiologist, and hospital in [place]  Patient denies shortness of breath, fever, cough and chest pain at PAT appointment   All instructions explained to the patient, with a verbal understanding of the material. Patient agrees to go over the instructions while at home for a better understanding. Patient also instructed to self quarantine after being tested for COVID-19. The opportunity to ask questions was provided.

## 2020-03-27 ENCOUNTER — Encounter: Payer: Self-pay | Admitting: Family Medicine

## 2020-03-27 DIAGNOSIS — I6320 Cerebral infarction due to unspecified occlusion or stenosis of unspecified precerebral arteries: Secondary | ICD-10-CM

## 2020-03-27 DIAGNOSIS — Z01810 Encounter for preprocedural cardiovascular examination: Secondary | ICD-10-CM

## 2020-03-28 ENCOUNTER — Inpatient Hospital Stay (HOSPITAL_COMMUNITY)
Admission: RE | Admit: 2020-03-28 | Payer: Managed Care, Other (non HMO) | Source: Home / Self Care | Admitting: Neurosurgery

## 2020-03-28 ENCOUNTER — Other Ambulatory Visit: Payer: Self-pay | Admitting: Neurosurgery

## 2020-03-28 ENCOUNTER — Encounter (HOSPITAL_COMMUNITY): Admission: RE | Payer: Self-pay | Source: Home / Self Care

## 2020-03-28 DIAGNOSIS — M4316 Spondylolisthesis, lumbar region: Secondary | ICD-10-CM

## 2020-03-28 SURGERY — POSTERIOR LUMBAR FUSION 1 LEVEL
Anesthesia: General

## 2020-03-28 NOTE — Telephone Encounter (Signed)
Order placed urgently.  I am sorry about them not doing her surgery

## 2020-03-28 NOTE — Telephone Encounter (Signed)
Routing to covering provider. Urgent Cardiologist referral pended for review.

## 2020-03-29 ENCOUNTER — Other Ambulatory Visit: Payer: Self-pay | Admitting: Physician Assistant

## 2020-03-29 DIAGNOSIS — I69398 Other sequelae of cerebral infarction: Secondary | ICD-10-CM

## 2020-04-04 ENCOUNTER — Ambulatory Visit
Admission: RE | Admit: 2020-04-04 | Discharge: 2020-04-04 | Disposition: A | Discharge status: Home / Self Care | Payer: Managed Care, Other (non HMO) | Source: Ambulatory Visit | Attending: Neurosurgery | Admitting: Neurosurgery

## 2020-04-04 ENCOUNTER — Other Ambulatory Visit: Payer: Self-pay

## 2020-04-04 DIAGNOSIS — M4316 Spondylolisthesis, lumbar region: Secondary | ICD-10-CM

## 2020-04-04 MED ORDER — METHYLPREDNISOLONE ACETATE 40 MG/ML INJ SUSP (RADIOLOG
120.0000 mg | Freq: Once | INTRAMUSCULAR | Status: AC
  Administered 2020-04-04: 120 mg via EPIDURAL

## 2020-04-04 MED ORDER — IOPAMIDOL (ISOVUE-M 200) INJECTION 41%
1.0000 mL | Freq: Once | INTRAMUSCULAR | Status: AC
  Administered 2020-04-04: 1 mL via EPIDURAL

## 2020-04-04 NOTE — Discharge Instructions (Signed)

## 2020-04-24 ENCOUNTER — Ambulatory Visit (INDEPENDENT_AMBULATORY_CARE_PROVIDER_SITE_OTHER): Payer: Self-pay | Admitting: Physician Assistant

## 2020-04-24 ENCOUNTER — Other Ambulatory Visit: Payer: Self-pay

## 2020-04-24 ENCOUNTER — Encounter: Payer: Self-pay | Admitting: Cardiovascular Disease

## 2020-04-24 ENCOUNTER — Telehealth: Payer: Self-pay | Admitting: Radiology

## 2020-04-24 ENCOUNTER — Ambulatory Visit (INDEPENDENT_AMBULATORY_CARE_PROVIDER_SITE_OTHER): Payer: Managed Care, Other (non HMO) | Admitting: Cardiovascular Disease

## 2020-04-24 DIAGNOSIS — I341 Nonrheumatic mitral (valve) prolapse: Secondary | ICD-10-CM | POA: Diagnosis not present

## 2020-04-24 DIAGNOSIS — E782 Mixed hyperlipidemia: Secondary | ICD-10-CM

## 2020-04-24 DIAGNOSIS — R0789 Other chest pain: Secondary | ICD-10-CM | POA: Insufficient documentation

## 2020-04-24 DIAGNOSIS — I10 Essential (primary) hypertension: Secondary | ICD-10-CM

## 2020-04-24 DIAGNOSIS — R002 Palpitations: Secondary | ICD-10-CM | POA: Diagnosis not present

## 2020-04-24 DIAGNOSIS — Z5329 Procedure and treatment not carried out because of patient's decision for other reasons: Secondary | ICD-10-CM

## 2020-04-24 LAB — HEPATIC FUNCTION PANEL
ALT: 38 IU/L — ABNORMAL HIGH (ref 0–32)
AST: 18 IU/L (ref 0–40)
Albumin: 4.2 g/dL (ref 3.8–4.9)
Alkaline Phosphatase: 105 IU/L (ref 48–121)
Bilirubin Total: 0.2 mg/dL (ref 0.0–1.2)
Bilirubin, Direct: 0.08 mg/dL (ref 0.00–0.40)
Total Protein: 6.6 g/dL (ref 6.0–8.5)

## 2020-04-24 LAB — TSH: TSH: 1.62 u[IU]/mL (ref 0.450–4.500)

## 2020-04-24 LAB — LIPID PANEL
Chol/HDL Ratio: 4.6 ratio — ABNORMAL HIGH (ref 0.0–4.4)
Cholesterol, Total: 180 mg/dL (ref 100–199)
HDL: 39 mg/dL — ABNORMAL LOW (ref >39)
LDL Chol Calc (NIH): 122 mg/dL — ABNORMAL HIGH (ref 0–99)
Triglycerides: 106 mg/dL (ref 0–149)
VLDL Cholesterol Cal: 19 mg/dL (ref 5–40)

## 2020-04-24 LAB — T4, FREE: Free T4: 1.09 ng/dL (ref 0.82–1.77)

## 2020-04-24 NOTE — Progress Notes (Signed)
04/24/2020 Samantha [name]   08/20/1966  [id number]  Primary Physician Jomarie Longs, PA-C Primary Cardiologist: Runell Gess MD Nicholes Calamity, [place]  HPI:  Samantha [name] is a 54 y.o. severely overweight divorced Caucasian female mother of 1 child, grandmother of 2 grandchildren referred by her PCP, Dr. Linford Arnold, for cardiovascular evaluation because of tachypalpitations and atypical chest pain.  She works as an Charity fundraiser at [employer].  She recently moved from [place] up to [place] 3 years ago to be closer to her mother and sisters.  Her cardiovascular risk factor profile is notable for a long history tobacco abuse smoking three quarters of a pack a day for last 35 years, treated hypertension, hyperlipidemia and diabetes.  There is no family history for heart disease.  She did have a stroke several years ago with resultant seizures on Keppra.  She is never had a heart attack.  She does have L4/L5 spondylolisthesis and is seeing Dr. Franky Macho for this who is anticipating doing surgery.  She does get steroid injections every several months.  She is noticed onset of tachypalpitations 2 weeks ago.  She is had 2 episodes lasting 1 to 2 minutes at a time associated with dizziness, Susano Cleckler oral paresthesias and numbness down her arms.   Current Meds  Medication Sig  . amLODipine (NORVASC) 10 MG tablet Take 1 tablet by mouth once daily (Patient taking differently: Take 10 mg by mouth daily. )  . aspirin EC 81 MG tablet Take 81 mg by mouth daily.  Marland Kitchen atorvastatin (LIPITOR) 40 MG tablet Take 1 tablet (40 mg total) by mouth daily.  Marland Kitchen buPROPion (WELLBUTRIN XL) 150 MG 24 hr tablet Take 1 tablet (150 mg total) by mouth every morning.  . gabapentin (NEURONTIN) 300 MG capsule One tab PO qHS for a week, then BID for a week, then TID. May double weekly to a max of 3,600mg /day  . insulin NPH Human (NOVOLIN N RELION) 100 UNIT/ML injection Take 15 units of insulin in the morning before  breakfast and before bed. Increase by 2 units every 2 days until fasting glucose is 90-130 and evening before bed glucose 120-180.  . INSULIN SYRINGE 1CC/29G 29G X 1/2" 1 ML MISC Use with novolin vials twice a day.  . levETIRAcetam (KEPPRA) 500 MG tablet Take 1 tablet by mouth twice daily  . metFORMIN (GLUCOPHAGE XR) 500 MG 24 hr tablet Take 1 tablet (500 mg total) by mouth 2 (two) times daily.  . mirtazapine (REMERON) 30 MG tablet Take 30 mg by mouth at bedtime.  . pioglitazone (ACTOS) 30 MG tablet Take 1 tablet by mouth once daily (Patient taking differently: Take 30 mg by mouth daily. )  . venlafaxine XR (EFFEXOR-XR) 150 MG 24 hr capsule TAKE 1 CAPSULE BY MOUTH ONCE DAILY WITH BREAKFAST (Patient taking differently: Take 150 mg by mouth daily with breakfast. )  . Vitamin D, Ergocalciferol, (DRISDOL) 1.25 MG (50000 UNIT) CAPS capsule Take 1 capsule (50,000 Units total) by mouth once a week.     Allergies  Allergen Reactions  . Ace Inhibitors Swelling  . Beta Adrenergic Blockers Other (See Comments)    Low heart rate  . Glipizide Nausea Only    Social History   Socioeconomic History  . Marital status: Divorced    Spouse name: Not on file  . Number of children: Not on file  . Years of education: Not on file  . Highest education level: Not on file  Occupational History  . Not on file  Tobacco Use  . Smoking status: Current Every Day Smoker    Packs/day: 1.00    Years: 25.00    Pack years: 25.00    Types: Cigarettes  . Smokeless tobacco: Never Used  Vaping Use  . Vaping Use: Never used  Substance and Sexual Activity  . Alcohol use: Not Currently  . Drug use: Never  . Sexual activity: Not Currently    Partners: Male    Birth control/protection: Condom  Other Topics Concern  . Not on file  Social History Narrative  . Not on file   Social Determinants of Health   Financial Resource Strain:   . Difficulty of Paying Living Expenses: Not on file  Food Insecurity:   .  Worried About Programme researcher, broadcasting/film/video in the Last Year: Not on file  . Ran Out of Food in the Last Year: Not on file  Transportation Needs:   . Lack of Transportation (Medical): Not on file  . Lack of Transportation (Non-Medical): Not on file  Physical Activity:   . Days of Exercise per Week: Not on file  . Minutes of Exercise per Session: Not on file  Stress:   . Feeling of Stress : Not on file  Social Connections:   . Frequency of Communication with Friends and Family: Not on file  . Frequency of Social Gatherings with Friends and Family: Not on file  . Attends Religious Services: Not on file  . Active Member of Clubs or Organizations: Not on file  . Attends Banker Meetings: Not on file  . Marital Status: Not on file  Intimate Partner Violence:   . Fear of Current or Ex-Partner: Not on file  . Emotionally Abused: Not on file  . Physically Abused: Not on file  . Sexually Abused: Not on file     Review of Systems: General: negative for chills, fever, night sweats or weight changes.  Cardiovascular: negative for chest pain, dyspnea on exertion, edema, orthopnea, palpitations, paroxysmal nocturnal dyspnea or shortness of breath Dermatological: negative for rash Respiratory: negative for cough or wheezing Urologic: negative for hematuria Abdominal: negative for nausea, vomiting, diarrhea, bright red blood per rectum, melena, or hematemesis Neurologic: negative for visual changes, syncope, or dizziness All other systems reviewed and are otherwise negative except as noted above.    Blood pressure 110/80, pulse 73, height 5\' 4"  (1.626 m), weight 232 lb 12.8 oz (105.6 kg).  General appearance: alert and no distress Neck: no adenopathy, no carotid bruit, no JVD, supple, symmetrical, trachea midline and thyroid not enlarged, symmetric, no tenderness/mass/nodules Lungs: clear to auscultation bilaterally Heart: regular rate and rhythm, S1, S2 normal, no murmur, click, rub or  gallop Extremities: extremities normal, atraumatic, no cyanosis or edema Pulses: 2+ and symmetric Skin: Skin color, texture, turgor normal. No rashes or lesions Neurologic: Alert and oriented X 3, normal strength and tone. Normal symmetric reflexes. Normal coordination and gait  EKG not performed today  ASSESSMENT AND PLAN:   Essential hypertension History of essential hypertension with blood pressure measured today 110/80.  She is on amlodipine.  Mixed hyperlipidemia History of hyperlipidemia on atorvastatin 40 mg a day.  We will recheck a lipid liver profile this morning.  Mitral valve prolapse History of mitral valve prolapse.  We will recheck a 2D echocardiogram.  Palpitations New onset palpitations for last 2 weeks.  She is had 2 episodes that began abruptly, last with 1 to 2 minutes are associated  with dizziness numbness in her lips and arms.  She feels like she may pass out but she has not.  They spontaneously resolve.  Am to get thyroid function tests and in 1 month event monitor to further evaluate  Atypical chest pain Random episodes of fullness that goes from her left upper trap chest across her chest down her right arm with numbness.  I am to get a coronary calcium score given her risk factors to further evaluate      Runell Gess MD [facility], [facility] 04/24/2020 8:53 AM

## 2020-04-24 NOTE — Progress Notes (Signed)
No show

## 2020-04-24 NOTE — Assessment & Plan Note (Signed)
History of essential hypertension with blood pressure measured today 110/80.  She is on amlodipine.

## 2020-04-24 NOTE — Telephone Encounter (Signed)
Enrolled patient for a 30 day [employer] Event monitor to be mailed to patients home.  

## 2020-04-24 NOTE — Assessment & Plan Note (Signed)
History of hyperlipidemia on atorvastatin 40 mg a day.  We will recheck a lipid liver profile this morning. 

## 2020-04-24 NOTE — Assessment & Plan Note (Signed)
New onset palpitations for last 2 weeks.  She is had 2 episodes that began abruptly, last with 1 to 2 minutes are associated with dizziness numbness in her lips and arms.  She feels like she may pass out but she has not.  They spontaneously resolve.  Am to get thyroid function tests and in 1 month event monitor to further evaluate

## 2020-04-24 NOTE — Patient Instructions (Signed)
Medication Instructions:  Your physician recommends that you continue on your current medications as directed. Please refer to the Current Medication list given to you today.  *If you need a refill on your cardiac medications before your next appointment, please call your pharmacy*   Lab Work: Lipid, hepatic, TSH, T4free If you have labs (blood work) drawn today and your tests are completely normal, you will receive your results only by: Marland Kitchen MyChart Message (if you have MyChart) OR . A paper copy in the mail If you have any lab test that is abnormal or we need to change your treatment, we will call you to review the results.   Testing/Procedures: Your physician has requested that you have an echocardiogram. Echocardiography is a painless test that uses sound waves to create images of your heart. It provides your doctor with information about the size and shape of your heart and how well your heart's chambers and valves are working. This procedure takes approximately one hour. There are no restrictions for this procedure. This will be done at our [facility] location:  [address]  Your physician has recommended that you wear an event monitor (30 day). Event monitors are medical devices that record the heart's electrical activity. Doctors most often [place] these monitors to diagnose arrhythmias. Arrhythmias are problems with the speed or rhythm of the heartbeat. The monitor is a small, portable device. You can wear one while you do your normal daily activities. This is usually used to diagnose what is causing palpitations/syncope (passing out).  CT coronary calcium score. This test is done at [address]. This is $150 out of pocket.   Coronary CalciumScan A coronary calcium scan is an imaging test used to look for deposits of calcium and other fatty materials (plaques) in the inner lining of the blood vessels of the heart (coronary arteries). These deposits  of calcium and plaques can partly clog and narrow the coronary arteries without producing any symptoms or warning signs. This puts a person at risk for a heart attack. This test can detect these deposits before symptoms develop. Tell a health care provider about:  Any allergies you have.  All medicines you are taking, including vitamins, herbs, eye drops, creams, and over-the-counter medicines.  Any problems you or family members have had with anesthetic medicines.  Any blood disorders you have.  Any surgeries you have had.  Any medical conditions you have.  Whether you are pregnant or may be pregnant. What are the risks? Generally, this is a safe procedure. However, problems may occur, including:  Harm to a pregnant woman and her unborn baby. This test involves the use of radiation. Radiation exposure can be dangerous to a pregnant woman and her unborn baby. If you are pregnant, you generally should not have this procedure done.  Slight increase in the risk of cancer. This is because of the radiation involved in the test. What happens before the procedure? No preparation is needed for this procedure. What happens during the procedure?  You will undress and remove any jewelry around your neck or chest.  You will put on a hospital gown.  Sticky electrodes will be placed on your chest. The electrodes will be connected to an electrocardiogram (ECG) machine to record a tracing of the electrical activity of your heart.  A CT scanner will take pictures of your heart. During this time, you will be asked to lie still and hold your breath for 2-3  seconds while a picture of your heart is being taken. The procedure may vary among health care providers and hospitals. What happens after the procedure?  You can get dressed.  You can return to your normal activities.  It is up to you to get the results of your test. Ask your health care provider, or the department that is doing the test,  when your results will be ready. Summary  A coronary calcium scan is an imaging test used to look for deposits of calcium and other fatty materials (plaques) in the inner lining of the blood vessels of the heart (coronary arteries).  Generally, this is a safe procedure. Tell your health care provider if you are pregnant or may be pregnant.  No preparation is needed for this procedure.  A CT scanner will take pictures of your heart.  You can return to your normal activities after the scan is done. This information is not intended to replace advice given to you by your health care provider. Make sure you discuss any questions you have with your health care provider. Document Released: 02/14/2008 Document Revised: 07/07/2016 Document Reviewed: 07/07/2016 Elsevier Interactive Patient Education  2017 [employer].   Follow-Up: At [facility], you and your health needs are our priority.  As part of our continuing mission to provide you with exceptional heart care, we have created designated Provider Care Teams.  These Care Teams include your primary Cardiologist (physician) and Advanced Practice Providers (APPs -  Physician Assistants and Nurse Practitioners) who all work together to provide you with the care you need, when you need it.  We recommend signing up for the patient portal called "MyChart".  Sign up information is provided on this After Visit Summary.  MyChart is used to connect with patients for Virtual Visits (Telemedicine).  Patients are able to view lab/test results, encounter notes, upcoming appointments, etc.  Non-urgent messages can be sent to your provider as well.   To learn more about what you can do with MyChart, go to [email or web address].    Your next appointment:   6-8 week(s)  The format for your next appointment:   In Person  Provider:   Nanetta Batty, MD

## 2020-04-24 NOTE — Assessment & Plan Note (Signed)
Random episodes of fullness that goes from her left upper trap chest across her chest down her right arm with numbness.  I am to get a coronary calcium score given her risk factors to further evaluate

## 2020-04-24 NOTE — Assessment & Plan Note (Signed)
History of mitral valve prolapse.  We will recheck a 2D echocardiogram. 

## 2020-04-30 ENCOUNTER — Telehealth: Payer: Self-pay | Admitting: Cardiovascular Disease

## 2020-04-30 ENCOUNTER — Other Ambulatory Visit: Payer: Self-pay

## 2020-04-30 DIAGNOSIS — I6320 Cerebral infarction due to unspecified occlusion or stenosis of unspecified precerebral arteries: Secondary | ICD-10-CM

## 2020-04-30 DIAGNOSIS — E782 Mixed hyperlipidemia: Secondary | ICD-10-CM

## 2020-04-30 MED ORDER — ATORVASTATIN CALCIUM 40 MG PO TABS: 80.0000 mg | ORAL_TABLET | Freq: Every day | ORAL | 5 refills | Status: DC

## 2020-04-30 NOTE — Telephone Encounter (Signed)
Patient is returning call to discuss results from lab work completed on 04/24/20.

## 2020-04-30 NOTE — Progress Notes (Signed)
The patient has been notified of the result and verbalized understanding. Pt aware to begin taking 80 mg Atorvastatin and repeat labs in 2 months.  All questions (if any) were answered. Leanord Hawking, RN 04/30/2020 9:05 AM

## 2020-05-03 ENCOUNTER — Ambulatory Visit (INDEPENDENT_AMBULATORY_CARE_PROVIDER_SITE_OTHER): Payer: Managed Care, Other (non HMO)

## 2020-05-03 DIAGNOSIS — R002 Palpitations: Secondary | ICD-10-CM | POA: Diagnosis not present

## 2020-05-04 ENCOUNTER — Other Ambulatory Visit: Payer: Self-pay

## 2020-05-04 DIAGNOSIS — I10 Essential (primary) hypertension: Secondary | ICD-10-CM

## 2020-05-04 DIAGNOSIS — I6320 Cerebral infarction due to unspecified occlusion or stenosis of unspecified precerebral arteries: Secondary | ICD-10-CM

## 2020-05-04 DIAGNOSIS — E782 Mixed hyperlipidemia: Secondary | ICD-10-CM

## 2020-05-04 NOTE — Progress Notes (Signed)
lipid

## 2020-05-11 ENCOUNTER — Ambulatory Visit (INDEPENDENT_AMBULATORY_CARE_PROVIDER_SITE_OTHER)
Admission: RE | Admit: 2020-05-11 | Discharge: 2020-05-11 | Disposition: A | Discharge status: Home / Self Care | Payer: Self-pay | Source: Ambulatory Visit | Attending: Cardiovascular Disease | Admitting: Cardiovascular Disease

## 2020-05-11 ENCOUNTER — Other Ambulatory Visit: Payer: Self-pay

## 2020-05-11 ENCOUNTER — Ambulatory Visit (HOSPITAL_COMMUNITY): Payer: Managed Care, Other (non HMO) | Attending: Cardiovascular Disease

## 2020-05-11 DIAGNOSIS — R002 Palpitations: Secondary | ICD-10-CM | POA: Insufficient documentation

## 2020-05-11 DIAGNOSIS — R0789 Other chest pain: Secondary | ICD-10-CM | POA: Diagnosis present

## 2020-05-11 DIAGNOSIS — I341 Nonrheumatic mitral (valve) prolapse: Secondary | ICD-10-CM

## 2020-05-11 LAB — ECHOCARDIOGRAM COMPLETE
Area-P 1/2: 3.15 cm2
S' Lateral: 2 cm

## 2020-05-14 ENCOUNTER — Telehealth: Payer: Self-pay

## 2020-05-14 ENCOUNTER — Other Ambulatory Visit: Payer: Self-pay

## 2020-05-14 DIAGNOSIS — Z01812 Encounter for preprocedural laboratory examination: Secondary | ICD-10-CM

## 2020-05-14 DIAGNOSIS — I10 Essential (primary) hypertension: Secondary | ICD-10-CM

## 2020-05-14 MED ORDER — METOPROLOL TARTRATE 100 MG PO TABS: ORAL_TABLET | ORAL | 0 refills | Status: DC

## 2020-05-14 NOTE — Telephone Encounter (Signed)
Your cardiac CT will be scheduled at one of the below locations:   [facility] [address] [place], [place] [address] [phone]  [place] [address] [place], [place] [address] [phone]  If scheduled at [facility], please arrive at the [facility]: [address] main entrance of [facility] 30 minutes prior to test start time. Proceed to the [facility] Radiology Department (first floor) to check-in and test prep.  If scheduled at [facility], please arrive 15 mins early for check-in and test prep.  Please follow these instructions carefully (unless otherwise directed):    On the Night Before the Test:  Be sure to Drink plenty of water.  Do not consume any caffeinated/decaffeinated beverages or chocolate 12 hours prior to your test.  Do not take any antihistamines 12 hours prior to your test.   On the Day of the Test:  Drink plenty of water. Do not drink any water within one hour of the test.  Do not eat any food 4 hours prior to the test.  You may take your regular medications prior to the test.   Take metoprolol 100 mg two hours prior to test.  FEMALES- please wear underwire-free bra if available         After the Test:  Drink plenty of water.  After receiving IV contrast, you may experience a mild flushed feeling. This is normal.  On occasion, you may experience a mild rash up to 24 hours after the test. This is not dangerous. If this occurs, you can take Benadryl 25 mg and increase your fluid intake.  If you experience trouble breathing, this can be serious. If it is severe call 911 IMMEDIATELY. If it is mild, please call our office.  If you take any of these medications: Glipizide/Metformin, Avandament, Glucavance, please do not take 48 hours after completing test unless otherwise instructed.   Once we have confirmed authorization from your  insurance company, we will call you to set up a date and time for your test. Based on how quickly your insurance processes prior authorizations requests, please allow up to 4 weeks to be contacted for scheduling your Cardiac CT appointment. Be advised that routine Cardiac CT appointments could be scheduled as many as 8 weeks after your provider has ordered it.  For non-scheduling related questions, please contact the cardiac imaging nurse navigator should you have any questions/concerns: Marchia Bond, Cardiac Imaging Nurse Navigator Burley Saver, Interim Cardiac Imaging Nurse [place] and Vascular Services Direct Office Dial: [phone]   For scheduling needs, including cancellations and rescheduling, please call Vivien Rota at [phone], option 3.

## 2020-06-05 ENCOUNTER — Ambulatory Visit (INDEPENDENT_AMBULATORY_CARE_PROVIDER_SITE_OTHER): Payer: Managed Care, Other (non HMO) | Admitting: Cardiovascular Disease

## 2020-06-05 ENCOUNTER — Other Ambulatory Visit: Payer: Self-pay

## 2020-06-05 ENCOUNTER — Encounter: Payer: Self-pay | Admitting: Cardiovascular Disease

## 2020-06-05 VITALS — BP 130/66 | HR 62 | Ht 64.5 in | Wt 234.6 lb

## 2020-06-05 DIAGNOSIS — R931 Abnormal findings on diagnostic imaging of heart and coronary circulation: Secondary | ICD-10-CM | POA: Diagnosis not present

## 2020-06-05 DIAGNOSIS — E782 Mixed hyperlipidemia: Secondary | ICD-10-CM

## 2020-06-05 MED ORDER — METOPROLOL TARTRATE 100 MG PO TABS: ORAL_TABLET | ORAL | 0 refills | Status: DC

## 2020-06-05 NOTE — Patient Instructions (Addendum)
Your cardiac CT will be scheduled at one of the below locations:   [facility] [address] [place], [place] [address] [phone]    If scheduled at [facility], please arrive at the [facility] main entrance of [facility] 30 minutes prior to test start time. Proceed to the [facility] Radiology Department (first floor) to check-in and test prep.   Please follow these instructions carefully (unless otherwise directed):   On the Night Before the Test: . Be sure to Drink plenty of water. . Do not consume any caffeinated/decaffeinated beverages or chocolate 12 hours prior to your test. . Do not take any antihistamines 12 hours prior to your test.  On the Day of the Test: . Drink plenty of water. Do not drink any water within one hour of the test. . Do not eat any food 4 hours prior to the test. . You may take your regular medications prior to the test.  . Take metoprolol (Lopressor) 100 mg two hours prior to test. . HOLD Furosemide/Hydrochlorothiazide morning of the test. . FEMALES- please wear underwire-free bra if available      After the Test: . Drink plenty of water. . After receiving IV contrast, you may experience a mild flushed feeling. This is normal. . On occasion, you may experience a mild rash up to 24 hours after the test. This is not dangerous. If this occurs, you can take Benadryl 25 mg and increase your fluid intake. . If you experience trouble breathing, this can be serious. If it is severe call 911 IMMEDIATELY. If it is mild, please call our office. . If you take any of these medications: Glipizide/Metformin, Avandament, Glucavance, please do not take 48 hours after completing test unless otherwise instructed.   Once we have confirmed authorization from your insurance company, we will call you to set up a date and time for your test. Based on how quickly your insurance processes prior authorizations requests, please allow up to 4  weeks to be contacted for scheduling your Cardiac CT appointment. Be advised that routine Cardiac CT appointments could be scheduled as many as 8 weeks after your provider has ordered it.  For non-scheduling related questions, please contact the cardiac imaging nurse navigator should you have any questions/concerns: Marchia Bond, Cardiac Imaging Nurse Navigator Burley Saver, Interim Cardiac Imaging Nurse [place] and Vascular Services Direct Office Dial: [phone]   For scheduling needs, including cancellations and rescheduling, please call Vivien Rota at [phone], option 3.   Your physician recommends that you schedule a follow-up appointment AFTER CT COMPLETE  REFERRAL TO PHARM MD FOR REPATHA

## 2020-06-05 NOTE — Progress Notes (Signed)
Samantha [name] returns today for follow-up.  Her 2D echo was essentially normal without evidence of mitral valve prolapse.  She was told she had MVP in the past.  She did wear a monitor for 2 weeks and has yet to send it back in.  She had a coronary calcium score of 829 and is scheduled to have a coronary CTA but this has not occurred yet.  She also has hyperlipidemia intolerant to statin therapy and we will refer her to our Pharm.D.'s to initiate Repatha therapy.  I will see her back after the coronary CTA to make further recommendations.  Runell Gess, M.D., FACP, [facility], [facility] [address] [place], [place]  [address]  [phone] 06/05/2020 4:04 PM

## 2020-06-11 ENCOUNTER — Other Ambulatory Visit: Payer: Self-pay | Admitting: Physician Assistant

## 2020-06-11 DIAGNOSIS — E1165 Type 2 diabetes mellitus with hyperglycemia: Secondary | ICD-10-CM

## 2020-06-11 DIAGNOSIS — I69398 Other sequelae of cerebral infarction: Secondary | ICD-10-CM

## 2020-06-11 DIAGNOSIS — R569 Unspecified convulsions: Secondary | ICD-10-CM

## 2020-06-12 MED ORDER — LEVETIRACETAM 500 MG PO TABS: 500.0000 mg | ORAL_TABLET | Freq: Two times a day (BID) | ORAL | 0 refills | Status: DC

## 2020-06-12 MED ORDER — PIOGLITAZONE HCL 30 MG PO TABS: 30.0000 mg | ORAL_TABLET | Freq: Every day | ORAL | 0 refills | Status: DC

## 2020-06-13 ENCOUNTER — Other Ambulatory Visit: Payer: Self-pay | Admitting: Physician Assistant

## 2020-06-13 DIAGNOSIS — G4700 Insomnia, unspecified: Secondary | ICD-10-CM

## 2020-06-13 DIAGNOSIS — F339 Major depressive disorder, recurrent, unspecified: Secondary | ICD-10-CM

## 2020-06-14 LAB — BASIC METABOLIC PANEL
BUN/Creatinine Ratio: 17 (ref 9–23)
BUN: 10 mg/dL (ref 6–24)
CO2: 26 mmol/L (ref 20–29)
Calcium: 9.3 mg/dL (ref 8.7–10.2)
Chloride: 100 mmol/L (ref 96–106)
Creatinine, Ser: 0.58 mg/dL (ref 0.57–1.00)
GFR calc Af Amer: 123 mL/min/{1.73_m2} (ref >59)
GFR calc non Af Amer: 106 mL/min/{1.73_m2} (ref >59)
Glucose: 166 mg/dL — ABNORMAL HIGH (ref 65–99)
Potassium: 3.4 mmol/L — ABNORMAL LOW (ref 3.5–5.2)
Sodium: 142 mmol/L (ref 134–144)

## 2020-06-17 ENCOUNTER — Other Ambulatory Visit: Payer: Self-pay | Admitting: Physician Assistant

## 2020-06-17 DIAGNOSIS — G4700 Insomnia, unspecified: Secondary | ICD-10-CM

## 2020-06-17 DIAGNOSIS — F339 Major depressive disorder, recurrent, unspecified: Secondary | ICD-10-CM

## 2020-06-18 ENCOUNTER — Telehealth (HOSPITAL_COMMUNITY): Payer: Self-pay | Admitting: Emergency Medicine

## 2020-06-18 NOTE — Telephone Encounter (Signed)
Reaching out to patient to offer assistance regarding upcoming cardiac imaging study; pt verbalizes understanding of appt date/time, parking situation and where to check in, pre-test NPO status and medications ordered, and verified current allergies; name and call back number provided for further questions should they arise Rockwell Alexandria RN Navigator Cardiac Imaging [facility] [phone] office [phone] cell   Pt reporting history of bradycardia (HR 40-50s) while on beta blocker.  Pt given 100mg  metoprolol tartrate for test.  EKG on 7/26 showed HR 65. I recommended he break tablet in half to take 50mg  dose. Pt verbalized understanding and appreciation. 8/26

## 2020-06-19 ENCOUNTER — Other Ambulatory Visit: Payer: Self-pay

## 2020-06-19 ENCOUNTER — Ambulatory Visit (HOSPITAL_COMMUNITY)
Admission: RE | Admit: 2020-06-19 | Discharge: 2020-06-19 | Disposition: A | Discharge status: Home / Self Care | Payer: Managed Care, Other (non HMO) | Source: Ambulatory Visit | Attending: Cardiovascular Disease | Admitting: Cardiovascular Disease

## 2020-06-19 DIAGNOSIS — R931 Abnormal findings on diagnostic imaging of heart and coronary circulation: Secondary | ICD-10-CM | POA: Insufficient documentation

## 2020-06-19 DIAGNOSIS — I2510 Atherosclerotic heart disease of native coronary artery without angina pectoris: Secondary | ICD-10-CM

## 2020-06-19 MED ORDER — IOHEXOL 350 MG/ML SOLN
90.0000 mL | Freq: Once | INTRAVENOUS | Status: AC | PRN
  Administered 2020-06-19: 90 mL via INTRAVENOUS

## 2020-06-19 MED ORDER — NITROGLYCERIN 0.4 MG SL SUBL
0.8000 mg | SUBLINGUAL_TABLET | Freq: Once | SUBLINGUAL | Status: AC
  Administered 2020-06-19: 0.8 mg via SUBLINGUAL

## 2020-06-19 MED ORDER — NITROGLYCERIN 0.4 MG SL SUBL
SUBLINGUAL_TABLET | SUBLINGUAL | Status: AC
  Filled 2020-06-19: qty 2

## 2020-06-20 ENCOUNTER — Ambulatory Visit (HOSPITAL_COMMUNITY)
Admission: RE | Admit: 2020-06-20 | Discharge: 2020-06-20 | Disposition: A | Discharge status: Home / Self Care | Payer: Managed Care, Other (non HMO) | Source: Ambulatory Visit | Attending: Cardiovascular Disease | Admitting: Cardiovascular Disease

## 2020-06-20 DIAGNOSIS — R931 Abnormal findings on diagnostic imaging of heart and coronary circulation: Secondary | ICD-10-CM | POA: Insufficient documentation

## 2020-06-21 DIAGNOSIS — I2510 Atherosclerotic heart disease of native coronary artery without angina pectoris: Secondary | ICD-10-CM | POA: Diagnosis not present

## 2020-06-22 NOTE — Progress Notes (Signed)
Dr. Linford Arnold not sure if you want to take her but she is Samantha [name] sister and I know she is your patient.

## 2020-06-22 NOTE — Progress Notes (Signed)
Samantha [name],   Your potassium is a little low ok to increase potassium rich foods or to take potassium supplement daily OTC. I would like for you to make appt so that we can better treat your sugars. You really need a1c.   Lesly Rubenstein

## 2020-06-26 ENCOUNTER — Telehealth: Payer: Self-pay | Admitting: Cardiovascular Disease

## 2020-06-26 NOTE — Telephone Encounter (Signed)
Patient seen by Dr. Allyson Sabal in 04/2020 and mentioned anticipating need for back surgery in the coming months. She also mentioned some palpitations and atypical chest pain at the time. Coronary calcium score was ordered and came back at 829. 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 FFR was ordered and was borderline line at distal LCX but otherwise within normal change. Aggressive risk factor was recommended. Zio monitor was also ordered and ended up showing no concerning arrhythmias. Given above work-up, suspect patient will be OK for surgery but wanted to call patient and make sure she has not had any new chest pain concerning for angina given possible borderline FFR at distal LCX. Attempted to call patient but she did not answer. Left message to call back and ask to speak with pre-op team.

## 2020-06-26 NOTE — Telephone Encounter (Signed)
   [facility] Pre-operative Risk Assessment      Request for surgical clearance:  1. What type of surgery is being performed? L4-5 posterior lumbar interbody fusion   2. When is this surgery scheduled? TBD   3. What type of clearance is required (medical clearance vs. Pharmacy clearance to hold med vs. Both)? Both  4. Are there any medications that need to be held prior to surgery and how long? ASA   5. Practice name and name of physician performing surgery? Dr. Ashok Pall with [facility]   6. What is the office phone number? [phone]    7.   What is the office fax number? [phone]  8.   Anesthesia type (None, local, MAC, general) ? General anesthesia    Samantha [name] 06/26/2020, 8:07 AM  _________________________________________________________________   (provider comments below)

## 2020-06-27 ENCOUNTER — Ambulatory Visit (INDEPENDENT_AMBULATORY_CARE_PROVIDER_SITE_OTHER): Payer: Managed Care, Other (non HMO) | Admitting: Pharmacist Clinician (PhC)/ Clinical Pharmacy Specialist

## 2020-06-27 ENCOUNTER — Other Ambulatory Visit: Payer: Self-pay

## 2020-06-27 DIAGNOSIS — E782 Mixed hyperlipidemia: Secondary | ICD-10-CM

## 2020-06-27 MED ORDER — ROSUVASTATIN CALCIUM 5 MG PO TABS: 5.0000 mg | ORAL_TABLET | Freq: Every day | ORAL | 3 refills | Status: DC

## 2020-06-27 NOTE — Assessment & Plan Note (Signed)
Patient with ASCVD and prior stroke, with LDL not at goal.  Previously failed atorvastatin x 2. Reviewed options for lowering LDL cholesterol, including ezetimibe, PCSK-9 inhibitors and bempedoic acid.  Discussed mechanisms of action, dosing, side effects and potential decreases in LDL cholesterol.  Answered all patient questions.  Because she has only failed one statin drug, will need to challenge with another before insurance will cover other options.  She is agreeable to start with rosuvastatin 5 mg once daily.  If she can tolerate this will consider increasing dose after 2-3 months.  Repeat labs in January to see if at goal.

## 2020-06-27 NOTE — Progress Notes (Addendum)
06/27/2020 Samantha [name] 1965/10/15 [id number]   HPI:  Samantha [name] is a 54 y.o. female patient of Dr Samantha [name], who presents today for a lipid clinic evaluation.  See pertinent past medical history below.  She recently had a coronary calcium screen which showed a score of 929, putting her in the 99th percentile for age and sex.  She was noted to have > 50% plaques in mid RCA, proximal LAD and mid LAD.   She also had a stroke back in 2018, after which she had some seizures, now controlled with Keppra  Past Medical History: CVA No residual deficits, on Keppra for seizures post event  hypertension Currently controlled on amlodipine  DM2 A1c 7.2 on pioglitazone  GERD OTC prevacid    Current Medications: none  Cholesterol Goals: LDL <70   Intolerant/previously tried: atorvastatin 40 mg caused myalgias (challenged with med two separate occasions)  Family history:  Father died at 59 at bypass surgery; mother living at 66 no heart issues; mgf with heart disease CABG; 2 sisters without heart issues; 1 son no issues (76)  Diet: mix of home and eating out; no breakfasts,  lunch is [employer] salad many days;  not much for dinner (lives alone); not a snacker  Exercise:  Severe back problems - needs clearance for back surgery, unable to exercise at present  Labs: 8/21: TC 180, TG 106, HDL 39, LDL 122   Current Outpatient Medications  Medication Sig Dispense Refill  . amLODipine (NORVASC) 10 MG tablet Take 1 tablet by mouth once daily (Patient taking differently: Take 10 mg by mouth daily. ) 90 tablet 1  . aspirin EC 81 MG tablet Take 81 mg by mouth daily.    Marland Kitchen levETIRAcetam (KEPPRA) 500 MG tablet Take 1 tablet (500 mg total) by mouth 2 (two) times daily. APPT FOR REFILLS 60 tablet 0  . mirtazapine (REMERON) 30 MG tablet Take 30 mg by mouth at bedtime.    Marland Kitchen oxyCODONE (OXY IR/ROXICODONE) 5 MG immediate release tablet Take 5 mg by mouth every 6 (six) hours as needed.    . pioglitazone (ACTOS)  30 MG tablet Take 1 tablet (30 mg total) by mouth daily. APPT FOR REFILLS 30 tablet 0  . venlafaxine XR (EFFEXOR-XR) 150 MG 24 hr capsule TAKE 1 CAPSULE BY MOUTH ONCE DAILY WITH BREAKFAST (Patient taking differently: Take 150 mg by mouth daily with breakfast. ) 90 capsule 1  . Vitamin D, Ergocalciferol, (DRISDOL) 1.25 MG (50000 UNIT) CAPS capsule Take 1 capsule (50,000 Units total) by mouth once a week. 12 capsule 1  . rosuvastatin (CRESTOR) 5 MG tablet Take 1 tablet (5 mg total) by mouth daily. 30 tablet 3   No current facility-administered medications for this visit.    Allergies  Allergen Reactions  . Ace Inhibitors Swelling  . Atorvastatin     myalgia  . Beta Adrenergic Blockers Other (See Comments)    [name] heart rate  . Glipizide Nausea Only    Past Medical History:  Diagnosis Date  . Anxiety   . Chronic bronchitis (HCC)   . Depression   . Diabetes mellitus without complication (HCC)   . GERD (gastroesophageal reflux disease)   . Hypertension   . Mitral valve prolapse    with regurgitation  . Seizures (HCC)   . Stroke (HCC)     Blood pressure 126/68, pulse 84, resp. rate 16, height 5' 4.5" (1.638 m), weight 233 lb 12.8 oz (106.1 kg), SpO2 98 %.   Mixed hyperlipidemia  Patient with ASCVD and prior stroke, with LDL not at goal.  Previously failed atorvastatin x 2. Reviewed options for lowering LDL cholesterol, including ezetimibe, PCSK-9 inhibitors and bempedoic acid.  Discussed mechanisms of action, dosing, side effects and potential decreases in LDL cholesterol.  Answered all patient questions.  Because she has only failed one statin drug, will need to challenge with another before insurance will cover other options.  She is agreeable to start with rosuvastatin 5 mg once daily.  If she can tolerate this will consider increasing dose after 2-3 months.  Repeat labs in January to see if at goal.     Phillips Hay PharmD CPP [facility] [address] [place], [place] [address] [phone]   07/23/2020: Patient unable to tolerate [name] dose Rosuvastatin. Developed joint pain and knee pain. Will process PA for PCSK9i and contact patient as needed for follow up.  Raquel Rodriguez-Guzman PharmD, BCPS, CPP [facility] [address] [place] [address] 07/23/2020 8:57 AM

## 2020-06-27 NOTE — Patient Instructions (Addendum)
Your Results:             Your most recent labs Goal  Total Cholesterol 180 < 200  Triglycerides 106 < 150  HDL (happy/good cholesterol) 39 > 40  LDL (lousy/bad cholesterol 122 < 70    Medication changes:  Start rosuvastatin 5 mg once daily.  If you develop muscle aches/joint pains, please stop immediately and call us at [phone] (Marsheila Alejo/Raquel).    Lab orders:  We will repeat labs after 3 months on new medication.  We will mail a lab order to you in about 2 months as a reminder   Thank you for choosing [facility]

## 2020-06-29 NOTE — Progress Notes (Signed)
Appointment has been made. No further questions at this time.  

## 2020-07-03 NOTE — Telephone Encounter (Signed)
Left message for the patient to call back and speak to the preop APP of the day. If we don't hear from her, then will ask Dr. Allyson Sabal to clear her on the next visit this month.

## 2020-07-10 ENCOUNTER — Ambulatory Visit (INDEPENDENT_AMBULATORY_CARE_PROVIDER_SITE_OTHER): Payer: Managed Care, Other (non HMO) | Admitting: Family Medicine

## 2020-07-10 ENCOUNTER — Encounter: Payer: Self-pay | Admitting: Family Medicine

## 2020-07-10 ENCOUNTER — Other Ambulatory Visit: Payer: Self-pay

## 2020-07-10 VITALS — BP 129/56 | HR 67 | Ht 65.0 in | Wt 235.0 lb

## 2020-07-10 DIAGNOSIS — I69398 Other sequelae of cerebral infarction: Secondary | ICD-10-CM

## 2020-07-10 DIAGNOSIS — F339 Major depressive disorder, recurrent, unspecified: Secondary | ICD-10-CM

## 2020-07-10 DIAGNOSIS — M5136 Other intervertebral disc degeneration, lumbar region: Secondary | ICD-10-CM

## 2020-07-10 DIAGNOSIS — E1165 Type 2 diabetes mellitus with hyperglycemia: Secondary | ICD-10-CM

## 2020-07-10 DIAGNOSIS — M5442 Lumbago with sciatica, left side: Secondary | ICD-10-CM

## 2020-07-10 DIAGNOSIS — R569 Unspecified convulsions: Secondary | ICD-10-CM

## 2020-07-10 DIAGNOSIS — I10 Essential (primary) hypertension: Secondary | ICD-10-CM | POA: Diagnosis not present

## 2020-07-10 DIAGNOSIS — F411 Generalized anxiety disorder: Secondary | ICD-10-CM

## 2020-07-10 DIAGNOSIS — Z1231 Encounter for screening mammogram for malignant neoplasm of breast: Secondary | ICD-10-CM

## 2020-07-10 DIAGNOSIS — G8929 Other chronic pain: Secondary | ICD-10-CM

## 2020-07-10 DIAGNOSIS — Z1211 Encounter for screening for malignant neoplasm of colon: Secondary | ICD-10-CM

## 2020-07-10 DIAGNOSIS — M51369 Other intervertebral disc degeneration, lumbar region without mention of lumbar back pain or lower extremity pain: Secondary | ICD-10-CM

## 2020-07-10 LAB — POCT GLYCOSYLATED HEMOGLOBIN (HGB A1C): Hemoglobin A1C: 7.5 % — AB (ref 4.0–5.6)

## 2020-07-10 MED ORDER — DAPAGLIFLOZIN PROPANEDIOL 10 MG PO TABS: 10.0000 mg | ORAL_TABLET | Freq: Every day | ORAL | 3 refills | Status: DC

## 2020-07-10 MED ORDER — CLONAZEPAM 0.5 MG PO TABS: 0.5000 mg | ORAL_TABLET | Freq: Every day | ORAL | 1 refills | Status: DC | PRN

## 2020-07-10 MED ORDER — LEVETIRACETAM 500 MG PO TABS: 500.0000 mg | ORAL_TABLET | Freq: Every day | ORAL | 1 refills | Status: DC

## 2020-07-10 NOTE — Assessment & Plan Note (Signed)
Would like referral to pain management at least until she can have the surgery hopefully early next year.  Find something local for her as she lives in [place].

## 2020-07-10 NOTE — Assessment & Plan Note (Signed)
Given her small quantity of clonazepam to use very sparingly did let her know that when she starts seeing pain management if she gets placed on narcotic pain medicine then we will need to discontinue the clonazepam.

## 2020-07-10 NOTE — Telephone Encounter (Signed)
Pt has appointment with Dr. Allyson Sabal on 07/20/20

## 2020-07-10 NOTE — Telephone Encounter (Signed)
   Primary Cardiologist: Nanetta Batty, MD  Chart reviewed as part of pre-operative protocol coverage. Left a voicemail for patient to call back for ongoing preop assessment. Given several attempts to reach the patient, would be reasonable to address preop status at her visit with Dr. Allyson Sabal 07/20/20.   I will remove from the preop pool at this time.   Pre-op covering staff: - Please add "pre-op clearance" to the appointment notes so provider is aware. - Please contact requesting surgeon's office via preferred method (i.e, phone, fax) to inform them of need for appointment prior to surgery.  Beatriz Stallion, PA-C  07/10/2020, 8:51 AM

## 2020-07-10 NOTE — Progress Notes (Signed)
Established Patient Office Visit  Subjective:  Patient ID: Samantha [name], female    DOB: 11/30/1965  Age: 54 y.o. MRN: [id number]  CC:  Chief Complaint  Patient presents with  . Diabetes    HPI Samantha [name] presents for   Diabetes - no hypoglycemic events. No wounds or sores that are not healing well. No increased thirst or urination. Checking glucose at home. Taking medications as prescribed without any side effects.  Was intolerant to Metformin and has been doing well with the Actos.  Hypertension- Pt denies chest pain, SOB, dizziness, or heart palpitations.  Taking meds as directed w/o problems.  Denies medication side effects.    F/U depression -so really struggling with the anxiety component of her depression.  She had tried buspirone but it really was not helpful when I last saw her we decided to try Wellbutrin to just see if that would help more with some of the focus issue.  She says it really did not make a difference so she discontinued it.  She was planning for low back surgery she just has persistent and chronic pain at this point.  Unfortunately her cardiac work-up was incredibly expensive and so she is waiting now until January February to have her back surgery.  She would like to have a new referral back to pain management we had initially tried to refer her while she was transitioning between jobs and did not have health insurance.  So she would like a new referral placed.  She would really also like to be able to get another injection in her back.  She said the last time she had one about 3 months ago it did give her about a good 6 weeks of significant relief.  Past Medical History:  Diagnosis Date  . Anxiety   . Chronic bronchitis (HCC)   . Depression   . Diabetes mellitus without complication (HCC)   . GERD (gastroesophageal reflux disease)   . Hypertension   . Mitral valve prolapse    with regurgitation  . Seizures (HCC)   . Stroke [facility])     Past  Surgical History:  Procedure Laterality Date  . ABDOMINAL HYSTERECTOMY    . CHOLECYSTECTOMY      Family History  Problem Relation Age of Onset  . Hypertension Father   . Heart attack Father   . Stroke Father   . Skin cancer Father   . Breast cancer Paternal Grandmother   . Heart attack Paternal Grandfather     Social History   Socioeconomic History  . Marital status: Divorced    Spouse name: Not on file  . Number of children: Not on file  . Years of education: Not on file  . Highest education level: Not on file  Occupational History  . Not on file  Tobacco Use  . Smoking status: Current Every Day Smoker    Packs/day: 1.00    Years: 25.00    Pack years: 25.00    Types: Cigarettes  . Smokeless tobacco: Never Used  Vaping Use  . Vaping Use: Never used  Substance and Sexual Activity  . Alcohol use: Not Currently  . Drug use: Never  . Sexual activity: Not Currently    Partners: Male    Birth control/protection: Condom  Other Topics Concern  . Not on file  Social History Narrative  . Not on file   Social Determinants of Health   Financial Resource Strain:   . Difficulty of Paying Living  Expenses: Not on file  Food Insecurity:   . Worried About Programme researcher, broadcasting/film/video in the Last Year: Not on file  . Ran Out of Food in the Last Year: Not on file  Transportation Needs:   . Lack of Transportation (Medical): Not on file  . Lack of Transportation (Non-Medical): Not on file  Physical Activity:   . Days of Exercise per Week: Not on file  . Minutes of Exercise per Session: Not on file  Stress:   . Feeling of Stress : Not on file  Social Connections:   . Frequency of Communication with Friends and Family: Not on file  . Frequency of Social Gatherings with Friends and Family: Not on file  . Attends Religious Services: Not on file  . Active Member of Clubs or Organizations: Not on file  . Attends Banker Meetings: Not on file  . Marital Status: Not on file   Intimate Partner Violence:   . Fear of Current or Ex-Partner: Not on file  . Emotionally Abused: Not on file  . Physically Abused: Not on file  . Sexually Abused: Not on file    Outpatient Medications Prior to Visit  Medication Sig Dispense Refill  . amLODipine (NORVASC) 10 MG tablet Take 1 tablet by mouth once daily (Patient taking differently: Take 10 mg by mouth daily. ) 90 tablet 1  . mirtazapine (REMERON) 30 MG tablet Take 30 mg by mouth at bedtime.    Marland Kitchen oxyCODONE (OXY IR/ROXICODONE) 5 MG immediate release tablet Take 5 mg by mouth every 6 (six) hours as needed.    . pioglitazone (ACTOS) 30 MG tablet Take 1 tablet (30 mg total) by mouth daily. APPT FOR REFILLS 30 tablet 0  . rosuvastatin (CRESTOR) 5 MG tablet Take 1 tablet (5 mg total) by mouth daily. 30 tablet 3  . venlafaxine XR (EFFEXOR-XR) 150 MG 24 hr capsule TAKE 1 CAPSULE BY MOUTH ONCE DAILY WITH BREAKFAST (Patient taking differently: Take 150 mg by mouth daily with breakfast. ) 90 capsule 1  . Vitamin D, Ergocalciferol, (DRISDOL) 1.25 MG (50000 UNIT) CAPS capsule Take 1 capsule (50,000 Units total) by mouth once a week. 12 capsule 1  . levETIRAcetam (KEPPRA) 500 MG tablet Take 1 tablet (500 mg total) by mouth 2 (two) times daily. APPT FOR REFILLS 60 tablet 0  . aspirin EC 81 MG tablet Take 81 mg by mouth daily. (Patient not taking: Reported on 07/10/2020)     No facility-administered medications prior to visit.    Allergies  Allergen Reactions  . Ace Inhibitors Swelling  . Atorvastatin     myalgia  . Metformin And Related Other (See Comments)    Diarrhea  . Beta Adrenergic Blockers Other (See Comments)    Low heart rate  . Glipizide Nausea Only    ROS Review of Systems    Objective:    Physical Exam Constitutional:      Appearance: She is well-developed.  HENT:     Head: Normocephalic and atraumatic.  Cardiovascular:     Rate and Rhythm: Normal rate and regular rhythm.     Heart sounds: Normal heart  sounds.  Pulmonary:     Effort: Pulmonary effort is normal.     Breath sounds: Normal breath sounds.  Skin:    General: Skin is warm and dry.  Neurological:     Mental Status: She is alert and oriented to person, place, and time.  Psychiatric:        Behavior:  Behavior normal.     BP (!) 129/56   Pulse 67   Ht 5\' 5"  (1.651 m)   Wt 235 lb (106.6 kg)   LMP  (LMP Unknown)   SpO2 98%   BMI 39.11 kg/m  Wt Readings from Last 3 Encounters:  07/10/20 235 lb (106.6 kg)  06/27/20 233 lb 12.8 oz (106.1 kg)  06/05/20 234 lb 9.6 oz (106.4 kg)     Health Maintenance Due  Topic Date Due  . Hepatitis C Screening  Never done  . INFLUENZA VACCINE  04/01/2020    There are no preventive care reminders to display for this patient.  Lab Results  Component Value Date   TSH 1.620 04/24/2020   Lab Results  Component Value Date   WBC 8.1 03/26/2020   HGB 14.8 03/26/2020   HCT 44.8 03/26/2020   MCV 91.8 03/26/2020   PLT 203 03/26/2020   Lab Results  Component Value Date   NA 142 06/13/2020   K 3.4 (L) 06/13/2020   CO2 26 06/13/2020   GLUCOSE 166 (H) 06/13/2020   BUN 10 06/13/2020   CREATININE 0.58 06/13/2020   BILITOT 0.2 04/24/2020   ALKPHOS 105 04/24/2020   AST 18 04/24/2020   ALT 38 (H) 04/24/2020   PROT 6.6 04/24/2020   ALBUMIN 4.2 04/24/2020   CALCIUM 9.3 06/13/2020   ANIONGAP 12 03/26/2020   Lab Results  Component Value Date   CHOL 180 04/24/2020   Lab Results  Component Value Date   HDL 39 (L) 04/24/2020   Lab Results  Component Value Date   LDLCALC 122 (H) 04/24/2020   Lab Results  Component Value Date   TRIG 106 04/24/2020   Lab Results  Component Value Date   CHOLHDL 4.6 (H) 04/24/2020   Lab Results  Component Value Date   HGBA1C 7.5 (A) 07/10/2020      Assessment & Plan:   Problem List Items Addressed This Visit      Cardiovascular and Mediastinum   Essential hypertension    Blood pressure looks great today.  Continue current regimen.   Recently had additional cardiac work-up in preparation for possible back surgery which will hopefully be in January or February.        Endocrine   Type II diabetes mellitus (HCC) - Primary    A1C is up from previous levels.  Discussed switching medications at like to see if her insurance will cover Farxiga and discontinued Actos. F/U in 3 months.  Continue to work on healthy diet and regular exercise.  We discussed the importance of getting the A1c under 7 especially prior to surgery.      Relevant Medications   dapagliflozin propanediol (FARXIGA) 10 MG TABS tablet   Other Relevant Orders   POCT glycosylated hemoglobin (Hb A1C) (Completed)     Nervous and Auditory   Seizure as late effect of cerebrovascular accident (CVA) (HCC)   Relevant Medications   levETIRAcetam (KEPPRA) 500 MG tablet   clonazePAM (KLONOPIN) 0.5 MG tablet   Chronic left-sided low back pain with left-sided sciatica    Would like referral to pain management at least until she can have the surgery hopefully early next year.  Find something local for her as she lives in [place].      Relevant Medications   levETIRAcetam (KEPPRA) 500 MG tablet   clonazePAM (KLONOPIN) 0.5 MG tablet   Other Relevant Orders   Ambulatory referral to Pain Clinic     Musculoskeletal and Integument  DDD (degenerative disc disease), lumbar    Unfortunately I cannot order the spinal injections for her that would either need to come from the surgeon or the orthopedist office.  I would encourage her to check back in with them to see if maybe they could just place an additional order without her having to come in necessarily especially since the surgery is going to be delayed.        Other   GAD (generalized anxiety disorder)    Given her small quantity of clonazepam to use very sparingly did let her know that when she starts seeing pain management if she gets placed on narcotic pain medicine then we will need to discontinue the  clonazepam.      Depression, recurrent (HCC)    Other Visit Diagnoses    Encounter for screening mammogram for malignant neoplasm of breast       Relevant Orders   MM 3D SCREEN BREAST BILATERAL   Screen for colon cancer       Relevant Orders   Cologuard      Meds ordered this encounter  Medications  . levETIRAcetam (KEPPRA) 500 MG tablet    Sig: Take 1 tablet (500 mg total) by mouth daily.    Dispense:  90 tablet    Refill:  1  . dapagliflozin propanediol (FARXIGA) 10 MG TABS tablet    Sig: Take 1 tablet (10 mg total) by mouth daily before breakfast.    Dispense:  30 tablet    Refill:  3  . clonazePAM (KLONOPIN) 0.5 MG tablet    Sig: Take 1 tablet (0.5 mg total) by mouth daily as needed for anxiety.    Dispense:  10 tablet    Refill:  1    Follow-up: Return in about 3 months (around 10/10/2020) for Diabetes follow-up.    Nani Gasseratherine Aspen Deterding, MD

## 2020-07-10 NOTE — Assessment & Plan Note (Signed)
A1C is up from previous levels.  Discussed switching medications at like to see if her insurance will cover Farxiga and discontinued Actos. F/U in 3 months.  Continue to work on healthy diet and regular exercise.  We discussed the importance of getting the A1c under 7 especially prior to surgery.

## 2020-07-10 NOTE — Assessment & Plan Note (Signed)
Unfortunately I cannot order the spinal injections for her that would either need to come from the surgeon or the orthopedist office.  I would encourage her to check back in with them to see if maybe they could just place an additional order without her having to come in necessarily especially since the surgery is going to be delayed.

## 2020-07-10 NOTE — Assessment & Plan Note (Signed)
Blood pressure looks great today.  Continue current regimen.  Recently had additional cardiac work-up in preparation for possible back surgery which will hopefully be in January or February.

## 2020-07-12 ENCOUNTER — Telehealth: Payer: Self-pay | Admitting: *Deleted

## 2020-07-12 NOTE — Telephone Encounter (Signed)
Samantha [name] approved with ins until 07/12/21. Pharmacy notified.

## 2020-07-20 ENCOUNTER — Other Ambulatory Visit: Payer: Self-pay

## 2020-07-20 ENCOUNTER — Ambulatory Visit (INDEPENDENT_AMBULATORY_CARE_PROVIDER_SITE_OTHER): Payer: Managed Care, Other (non HMO) | Admitting: Cardiovascular Disease

## 2020-07-20 ENCOUNTER — Encounter: Payer: Self-pay | Admitting: Family Medicine

## 2020-07-20 ENCOUNTER — Encounter: Payer: Self-pay | Admitting: Cardiovascular Disease

## 2020-07-20 VITALS — BP 138/78 | HR 68 | Ht 65.0 in | Wt 230.0 lb

## 2020-07-20 DIAGNOSIS — E782 Mixed hyperlipidemia: Secondary | ICD-10-CM

## 2020-07-20 DIAGNOSIS — Z720 Tobacco use: Secondary | ICD-10-CM | POA: Insufficient documentation

## 2020-07-20 NOTE — Progress Notes (Signed)
Samantha [name] returns a for follow-up.  Her event monitor which she wore for about a week showed no arrhythmias.  Her 2D echo showed no evidence of mitral valve prolapse.  Her coronary calcium score was elevated at 829 although the coronary CTA performed 06/20/2020, read by Dr. Duke Salvia, showed no evidence of obstructive disease.  I strongly urged her to stop smoking.  She was tried on rosuvastatin which she did not tolerate as well and therefore she is statin intolerant.  I am referring her back to our Pharm.D. to initiate Repatha therapy.  I will see her back in 6 months.  Runell Gess, M.D., FACP, [facility], [facility] [address] [place], [place]  [address]  [phone] 07/20/2020 4:40 PM

## 2020-07-20 NOTE — Patient Instructions (Signed)
Medication Instructions:  No changes *If you need a refill on your cardiac medications before your next appointment, please call your pharmacy*   Lab Work: None ordered If you have labs (blood work) drawn today and your tests are completely normal, you will receive your results only by: Marland Kitchen MyChart Message (if you have MyChart) OR . A paper copy in the mail If you have any lab test that is abnormal or we need to change your treatment, we will call you to review the results.   Testing/Procedures: None ordered   Follow-Up: At [facility], you and your health needs are our priority.  As part of our continuing mission to provide you with exceptional heart care, we have created designated Provider Care Teams.  These Care Teams include your primary Cardiologist (physician) and Advanced Practice Providers (APPs -  Physician Assistants and Nurse Practitioners) who all work together to provide you with the care you need, when you need it.  We recommend signing up for the patient portal called "MyChart".  Sign up information is provided on this After Visit Summary.  MyChart is used to connect with patients for Virtual Visits (Telemedicine).  Patients are able to view lab/test results, encounter notes, upcoming appointments, etc.  Non-urgent messages can be sent to your provider as well.   To learn more about what you can do with MyChart, go to [email or web address].    Your next appointment:   6 month(s)  The format for your next appointment:   In Person  Provider:   You may see Nanetta Batty, MD or one of the following Advanced Practice Providers on your designated Care Team:    Corine Shelter, PA-C  [place], [place]  Edd Fabian, [place]    Other Instructions Appointment with pharmd to start Repatha.

## 2020-07-23 ENCOUNTER — Ambulatory Visit: Payer: Managed Care, Other (non HMO)

## 2020-07-23 ENCOUNTER — Telehealth: Payer: Self-pay | Admitting: Pharmacist

## 2020-07-23 DIAGNOSIS — E782 Mixed hyperlipidemia: Secondary | ICD-10-CM

## 2020-07-23 MED ORDER — REPATHA SURECLICK 140 MG/ML ~~LOC~~ SOAJ: 140.0000 mg | SUBCUTANEOUS | 11 refills | Status: DC

## 2020-07-23 NOTE — Telephone Encounter (Signed)
Called and spoke w/pt ragerding the approval of the repatha, rx sent, lipids/lft ordered, pt instructed to complete fasting lipids after 4th injection, pt voiced understanding

## 2020-07-23 NOTE — Progress Notes (Deleted)
Patient ID: Samantha [name]                 DOB: 11/08/1965                    MRN: [id number]     HPI: Samantha [name] is a 54 y.o. female patient referred to lipid clinic by ***. PMH is significant for   Current Medications:  Intolerances:  LDL goal: 70mg dL (< 55 ideal)  Diet:   Exercise:   Family History:   Social History:   Labs:  Past Medical History:  Diagnosis Date  . Anxiety   . Chronic bronchitis (HCC)   . Depression   . Diabetes mellitus without complication (HCC)   . GERD (gastroesophageal reflux disease)   . Hypertension   . Mitral valve prolapse    with regurgitation  . Seizures (HCC)   . Stroke [facility])     Current Outpatient Medications on File Prior to Visit  Medication Sig Dispense Refill  . amLODipine (NORVASC) 10 MG tablet Take 1 tablet by mouth once daily (Patient taking differently: Take 10 mg by mouth daily. ) 90 tablet 1  . aspirin EC 81 MG tablet Take 81 mg by mouth daily.     . clonazePAM (KLONOPIN) 0.5 MG tablet Take 1 tablet (0.5 mg total) by mouth daily as needed for anxiety. 10 tablet 1  . dapagliflozin propanediol (FARXIGA) 10 MG TABS tablet Take 1 tablet (10 mg total) by mouth daily before breakfast. 30 tablet 3  . levETIRAcetam (KEPPRA) 500 MG tablet Take 1 tablet (500 mg total) by mouth daily. 90 tablet 1  . mirtazapine (REMERON) 30 MG tablet Take 30 mg by mouth at bedtime.    Marland Kitchen oxyCODONE (OXY IR/ROXICODONE) 5 MG immediate release tablet Take 5 mg by mouth every 6 (six) hours as needed.    . rosuvastatin (CRESTOR) 5 MG tablet Take 1 tablet (5 mg total) by mouth daily. 30 tablet 3  . venlafaxine XR (EFFEXOR-XR) 150 MG 24 hr capsule TAKE 1 CAPSULE BY MOUTH ONCE DAILY WITH BREAKFAST (Patient taking differently: Take 150 mg by mouth daily with breakfast. ) 90 capsule 1  . Vitamin D, Ergocalciferol, (DRISDOL) 1.25 MG (50000 UNIT) CAPS capsule Take 1 capsule (50,000 Units total) by mouth once a week. 12 capsule 1   No current  facility-administered medications on file prior to visit.    Allergies  Allergen Reactions  . Ace Inhibitors Swelling  . Atorvastatin     myalgia  . Metformin And Related Other (See Comments)    Diarrhea  . Beta Adrenergic Blockers Other (See Comments)    Low heart rate  . Glipizide Nausea Only    No problem-specific Assessment & Plan notes found for this encounter.

## 2020-07-29 ENCOUNTER — Other Ambulatory Visit: Payer: Self-pay | Admitting: Physician Assistant

## 2020-07-29 DIAGNOSIS — F339 Major depressive disorder, recurrent, unspecified: Secondary | ICD-10-CM

## 2020-07-29 DIAGNOSIS — E559 Vitamin D deficiency, unspecified: Secondary | ICD-10-CM

## 2020-07-29 DIAGNOSIS — G4700 Insomnia, unspecified: Secondary | ICD-10-CM

## 2020-07-31 ENCOUNTER — Telehealth: Payer: Self-pay | Admitting: Cardiovascular Disease

## 2020-07-31 NOTE — Telephone Encounter (Signed)
   [facility] Pre-operative Risk Assessment    Request for surgical clearance:  1. What type of surgery is being performed? L4-5 posterior lumbar interbody fusion   2. When is this surgery scheduled? TBD   3. What type of clearance is required (medical clearance vs. Pharmacy clearance to hold med vs. Both)?  Both   4. Are there any medications that need to be held prior to surgery and how long? ASA   5. Practice name and name of physician performing surgery? Dr. Ashok Pall @ [facility]   6. What is the office phone number? [phone]   7.   What is the office fax number? [phone] Attn: Janett Billow  8.   Anesthesia type (None, local, MAC, general) ? General    Samantha [name] 07/31/2020, 4:31 PM  _________________________________________________________________   (provider comments below)

## 2020-08-01 NOTE — Telephone Encounter (Signed)
Dr. Allyson Sabal -  We have made multiple attempts to reach this patient for clearance. I see that you were able to touch base with her in clinic 11/19. She has an elevated calcium score but nonobstructive disease by coronary CT. OK for back surgery and to hold ASA?

## 2020-08-01 NOTE — Telephone Encounter (Signed)
Yes, OK for back surgery and to hold ASA

## 2020-08-02 NOTE — Telephone Encounter (Signed)
   Primary Cardiologist: Nanetta Batty, MD  Chart reviewed as part of pre-operative protocol coverage.  Samantha [name] was last seen on 07/20/20 by Dr. Allyson Sabal.  Per Dr. Allyson Sabal - she is cleared for surgery and may hold ASA 5-7 days prior to surgery.    Therefore, based on ACC/AHA guidelines, the patient would be at acceptable risk for the planned procedure without further cardiovascular testing.   The patient was advised that if she develops new symptoms prior to surgery to contact our office to arrange for a follow-up visit, and she verbalized understanding.  I will route this recommendation to the requesting party via Epic fax function and remove from pre-op pool. Please call with questions.  Roe Rutherford Tuwanna Krausz, PA 08/02/2020, 8:06 AM

## 2020-09-08 ENCOUNTER — Other Ambulatory Visit: Payer: Self-pay | Admitting: Physician Assistant

## 2020-09-08 DIAGNOSIS — R569 Unspecified convulsions: Secondary | ICD-10-CM

## 2020-09-08 DIAGNOSIS — I10 Essential (primary) hypertension: Secondary | ICD-10-CM

## 2020-09-08 DIAGNOSIS — F339 Major depressive disorder, recurrent, unspecified: Secondary | ICD-10-CM

## 2020-09-08 DIAGNOSIS — I69398 Other sequelae of cerebral infarction: Secondary | ICD-10-CM

## 2020-10-10 ENCOUNTER — Ambulatory Visit: Payer: Managed Care, Other (non HMO) | Admitting: Family Medicine

## 2020-10-10 NOTE — Progress Notes (Deleted)
Established Patient Office Visit  Subjective:  Patient ID: Samantha [name], female    DOB: June 02, 1966  Age: 55 y.o. MRN: [id number]  CC: No chief complaint on file.   HPI Samantha [name] presents for   Diabetes - no hypoglycemic events. No wounds or sores that are not healing well. No increased thirst or urination. Checking glucose at home. Taking medications as prescribed without any side effects.  Hypertension- Pt denies chest pain, SOB, dizziness, or heart palpitations.  Taking meds as directed w/o problems.  Denies medication side effects.      Past Medical History:  Diagnosis Date  . Anxiety   . Chronic bronchitis (HCC)   . Depression   . Diabetes mellitus without complication (HCC)   . GERD (gastroesophageal reflux disease)   . Hypertension   . Mitral valve prolapse    with regurgitation  . Seizures (HCC)   . Stroke [facility])     Past Surgical History:  Procedure Laterality Date  . ABDOMINAL HYSTERECTOMY    . CHOLECYSTECTOMY      Family History  Problem Relation Age of Onset  . Hypertension Father   . Heart attack Father   . Stroke Father   . Skin cancer Father   . Breast cancer Paternal Grandmother   . Heart attack Paternal Grandfather     Social History   Socioeconomic History  . Marital status: Divorced    Spouse name: Not on file  . Number of children: Not on file  . Years of education: Not on file  . Highest education level: Not on file  Occupational History  . Not on file  Tobacco Use  . Smoking status: Current Every Day Smoker    Packs/day: 1.00    Years: 25.00    Pack years: 25.00    Types: Cigarettes  . Smokeless tobacco: Never Used  Vaping Use  . Vaping Use: Never used  Substance and Sexual Activity  . Alcohol use: Not Currently  . Drug use: Never  . Sexual activity: Not Currently    Partners: Male    Birth control/protection: Condom  Other Topics Concern  . Not on file  Social History Narrative  . Not on file   Social  Determinants of Health   Financial Resource Strain: Not on file  Food Insecurity: Not on file  Transportation Needs: Not on file  Physical Activity: Not on file  Stress: Not on file  Social Connections: Not on file  Intimate Partner Violence: Not on file    Outpatient Medications Prior to Visit  Medication Sig Dispense Refill  . mirtazapine (REMERON) 45 MG tablet TAKE 1 TABLET BY MOUTH AT BEDTIME 90 tablet 0  . Vitamin D, Ergocalciferol, (DRISDOL) 1.25 MG (50000 UNIT) CAPS capsule Take 1 capsule by mouth once a week 12 capsule 0  . amLODipine (NORVASC) 10 MG tablet Take 1 tablet by mouth once daily 90 tablet 0  . aspirin EC 81 MG tablet Take 81 mg by mouth daily.     . clonazePAM (KLONOPIN) 0.5 MG tablet Take 1 tablet (0.5 mg total) by mouth daily as needed for anxiety. 10 tablet 1  . dapagliflozin propanediol (FARXIGA) 10 MG TABS tablet Take 1 tablet (10 mg total) by mouth daily before breakfast. 30 tablet 3  . Evolocumab (REPATHA SURECLICK) 140 MG/ML SOAJ Inject 140 mg into the skin every 14 (fourteen) days. 2 mL 11  . levETIRAcetam (KEPPRA) 500 MG tablet Take 1 tablet (500 mg total) by mouth daily.  90 tablet 1  . mirtazapine (REMERON) 30 MG tablet Take 30 mg by mouth at bedtime.    Marland Kitchen oxyCODONE (OXY IR/ROXICODONE) 5 MG immediate release tablet Take 5 mg by mouth every 6 (six) hours as needed.    . rosuvastatin (CRESTOR) 5 MG tablet Take 1 tablet (5 mg total) by mouth daily. 30 tablet 3  . venlafaxine XR (EFFEXOR-XR) 150 MG 24 hr capsule TAKE 1 CAPSULE BY MOUTH ONCE DAILY WITH BREAKFAST 90 capsule 0   No facility-administered medications prior to visit.    Allergies  Allergen Reactions  . Ace Inhibitors Swelling  . Atorvastatin     myalgia  . Metformin And Related Other (See Comments)    Diarrhea  . Beta Adrenergic Blockers Other (See Comments)    Low heart rate  . Glipizide Nausea Only    ROS Review of Systems    Objective:    Physical Exam  LMP  (LMP Unknown)  Wt  Readings from Last 3 Encounters:  07/20/20 230 lb (104.3 kg)  07/10/20 235 lb (106.6 kg)  06/27/20 233 lb 12.8 oz (106.1 kg)     Health Maintenance Due  Topic Date Due  . Hepatitis C Screening  Never done  . PNEUMOCOCCAL POLYSACCHARIDE VACCINE AGE 28-64 HIGH RISK  Never done  . TETANUS/TDAP  Never done  . COLONOSCOPY (Pts 45-15yrs Insurance coverage will need to be confirmed)  Never done  . MAMMOGRAM  Never done  . INFLUENZA VACCINE  04/01/2020  . COVID-19 Vaccine (3 - Booster for Moderna series) 06/10/2020  . FOOT EXAM  10/09/2020  . URINE MICROALBUMIN  10/09/2020    There are no preventive care reminders to display for this patient.  Lab Results  Component Value Date   TSH 1.620 04/24/2020   Lab Results  Component Value Date   WBC 8.1 03/26/2020   HGB 14.8 03/26/2020   HCT 44.8 03/26/2020   MCV 91.8 03/26/2020   PLT 203 03/26/2020   Lab Results  Component Value Date   NA 142 06/13/2020   K 3.4 (L) 06/13/2020   CO2 26 06/13/2020   GLUCOSE 166 (H) 06/13/2020   BUN 10 06/13/2020   CREATININE 0.58 06/13/2020   BILITOT 0.2 04/24/2020   ALKPHOS 105 04/24/2020   AST 18 04/24/2020   ALT 38 (H) 04/24/2020   PROT 6.6 04/24/2020   ALBUMIN 4.2 04/24/2020   CALCIUM 9.3 06/13/2020   ANIONGAP 12 03/26/2020   Lab Results  Component Value Date   CHOL 180 04/24/2020   Lab Results  Component Value Date   HDL 39 (L) 04/24/2020   Lab Results  Component Value Date   LDLCALC 122 (H) 04/24/2020   Lab Results  Component Value Date   TRIG 106 04/24/2020   Lab Results  Component Value Date   CHOLHDL 4.6 (H) 04/24/2020   Lab Results  Component Value Date   HGBA1C 7.5 (A) 07/10/2020      Assessment & Plan:   Problem List Items Addressed This Visit      Cardiovascular and Mediastinum   Essential hypertension     Endocrine   Type II diabetes mellitus (HCC) - Primary      No orders of the defined types were placed in this encounter.   Follow-up: No  follow-ups on file.    Nani Gasser, MD

## 2020-11-02 ENCOUNTER — Emergency Department (HOSPITAL_COMMUNITY): Payer: No Typology Code available for payment source

## 2020-11-02 ENCOUNTER — Emergency Department (HOSPITAL_COMMUNITY)
Admission: EM | Admit: 2020-11-02 | Discharge: 2020-11-02 | Disposition: A | Discharge status: Home / Self Care | Payer: No Typology Code available for payment source | Attending: Emergency Medicine | Admitting: Emergency Medicine

## 2020-11-02 ENCOUNTER — Encounter (HOSPITAL_COMMUNITY): Payer: Self-pay

## 2020-11-02 ENCOUNTER — Other Ambulatory Visit: Payer: Self-pay

## 2020-11-02 DIAGNOSIS — Z7982 Long term (current) use of aspirin: Secondary | ICD-10-CM | POA: Insufficient documentation

## 2020-11-02 DIAGNOSIS — W010XXA Fall on same level from slipping, tripping and stumbling without subsequent striking against object, initial encounter: Secondary | ICD-10-CM | POA: Diagnosis not present

## 2020-11-02 DIAGNOSIS — E119 Type 2 diabetes mellitus without complications: Secondary | ICD-10-CM | POA: Diagnosis not present

## 2020-11-02 DIAGNOSIS — S62367A Nondisplaced fracture of neck of fifth metacarpal bone, left hand, initial encounter for closed fracture: Secondary | ICD-10-CM

## 2020-11-02 DIAGNOSIS — S63502A Unspecified sprain of left wrist, initial encounter: Secondary | ICD-10-CM

## 2020-11-02 DIAGNOSIS — I10 Essential (primary) hypertension: Secondary | ICD-10-CM | POA: Insufficient documentation

## 2020-11-02 DIAGNOSIS — Z79899 Other long term (current) drug therapy: Secondary | ICD-10-CM | POA: Diagnosis not present

## 2020-11-02 DIAGNOSIS — S62307A Unspecified fracture of fifth metacarpal bone, left hand, initial encounter for closed fracture: Secondary | ICD-10-CM | POA: Insufficient documentation

## 2020-11-02 DIAGNOSIS — Z8673 Personal history of transient ischemic attack (TIA), and cerebral infarction without residual deficits: Secondary | ICD-10-CM | POA: Insufficient documentation

## 2020-11-02 DIAGNOSIS — S6992XA Unspecified injury of left wrist, hand and finger(s), initial encounter: Secondary | ICD-10-CM | POA: Diagnosis present

## 2020-11-02 DIAGNOSIS — F17210 Nicotine dependence, cigarettes, uncomplicated: Secondary | ICD-10-CM | POA: Insufficient documentation

## 2020-11-02 MED ORDER — HYDROMORPHONE HCL 1 MG/ML IJ SOLN
1.0000 mg | Freq: Once | INTRAMUSCULAR | Status: AC
  Administered 2020-11-02: 1 mg via INTRAMUSCULAR
  Filled 2020-11-02: qty 1

## 2020-11-02 NOTE — ED Provider Notes (Addendum)
[facility] EMERGENCY DEPARTMENT Provider Note   CSN: [id number] Arrival date & time: 11/02/20  5993     History Chief Complaint  Patient presents with  . Fall    Left hand and arm pain from fall    Samantha [name] is a 55 y.o. female.  Patient c/o trip and fall last pm w injury to left wrist and hand. Patient indicates was pushing garbage container when tripped and fell onto outstretched left wrist/hand. Symptoms acute onset, moderate, constant, dull, persistent, worse w palpation. Denies faintness or dizziness prior to fall. No loc. No headache. No neck or back pain. Skin is intact. Pt is right hand dominant. Denies prior fracture. Pt denies other pain or injury.  The history is provided by the patient.  Fall Pertinent negatives include no chest pain, no abdominal pain, no headaches and no shortness of breath.       Past Medical History:  Diagnosis Date  . Anxiety   . Chronic bronchitis (HCC)   . Depression   . Diabetes mellitus without complication (HCC)   . GERD (gastroesophageal reflux disease)   . Hypertension   . Mitral valve prolapse    with regurgitation  . Seizures (HCC)   . Stroke [facility])     Patient Active Problem List   Diagnosis Date Noted  . Tobacco abuse 07/20/2020  . GAD (generalized anxiety disorder) 07/10/2020  . Mitral valve prolapse 04/24/2020  . Palpitations 04/24/2020  . Atypical chest pain 04/24/2020  . Insomnia 10/17/2019  . Urinary frequency 10/17/2019  . Depression, recurrent (HCC) 10/10/2019  . Spinal stenosis at L4-L5 level 02/09/2019  . Anterolisthesis 02/09/2019  . DDD (degenerative disc disease), lumbar 02/06/2019  . B12 deficiency anemia 12/01/2018  . Chronic fatigue 12/01/2018  . GERD (gastroesophageal reflux disease) 12/01/2018  . Dysthymia 12/01/2018  . Chronic left-sided low back pain with left-sided sciatica 11/26/2018  . Class 2 severe obesity due to excess calories with serious comorbidity and body mass index (BMI) of  37.0 to 37.9 in adult [facility]) 11/26/2018  . Mixed hyperlipidemia 11/26/2018  . Type II diabetes mellitus (HCC) 11/22/2018  . Seizure as late effect of cerebrovascular accident (CVA) (HCC) 11/22/2018  . Cerebrovascular accident (CVA) due to occlusion of precerebral artery (HCC) 11/22/2018  . Essential hypertension 11/22/2018  . Vitamin D deficiency 11/22/2018  . Edema 04/25/2017    Past Surgical History:  Procedure Laterality Date  . ABDOMINAL HYSTERECTOMY    . CHOLECYSTECTOMY       OB History   No obstetric history on file.     Family History  Problem Relation Age of Onset  . Hypertension Father   . Heart attack Father   . Stroke Father   . Skin cancer Father   . Breast cancer Paternal Grandmother   . Heart attack Paternal Grandfather     Social History   Tobacco Use  . Smoking status: Current Every Day Smoker    Packs/day: 1.00    Years: 25.00    Pack years: 25.00    Types: Cigarettes  . Smokeless tobacco: Never Used  Vaping Use  . Vaping Use: Never used  Substance Use Topics  . Alcohol use: Not Currently  . Drug use: Never    Home Medications Prior to Admission medications   Medication Sig Start Date End Date Taking? Authorizing Provider  mirtazapine (REMERON) 45 MG tablet TAKE 1 TABLET BY MOUTH AT BEDTIME 07/30/20   Agapito Games, MD  Vitamin D, Ergocalciferol, (DRISDOL) 1.25 MG (  50000 UNIT) CAPS capsule Take 1 capsule by mouth once a week 07/30/20   Agapito GamesMetheney, Catherine D, MD  amLODipine (NORVASC) 10 MG tablet Take 1 tablet by mouth once daily 09/09/20   Agapito GamesMetheney, Catherine D, MD  aspirin EC 81 MG tablet Take 81 mg by mouth daily.     [provider]  clonazePAM (KLONOPIN) 0.5 MG tablet Take 1 tablet (0.5 mg total) by mouth daily as needed for anxiety. 07/10/20   Agapito GamesMetheney, Catherine D, MD  dapagliflozin propanediol (FARXIGA) 10 MG TABS tablet Take 1 tablet (10 mg total) by mouth daily before breakfast. 07/10/20   Agapito GamesMetheney, Catherine D, MD   Evolocumab (REPATHA SURECLICK) 140 MG/ML SOAJ Inject 140 mg into the skin every 14 (fourteen) days. 07/23/20   Runell GessBerry, Jonathan J, MD  levETIRAcetam (KEPPRA) 500 MG tablet Take 1 tablet (500 mg total) by mouth daily. 07/10/20   Agapito GamesMetheney, Catherine D, MD  mirtazapine (REMERON) 30 MG tablet Take 30 mg by mouth at bedtime.    [provider]  oxyCODONE (OXY IR/ROXICODONE) 5 MG immediate release tablet Take 5 mg by mouth every 6 (six) hours as needed. 04/02/20   [provider]  rosuvastatin (CRESTOR) 5 MG tablet Take 1 tablet (5 mg total) by mouth daily. 06/27/20 09/25/20  Runell GessBerry, Jonathan J, MD  venlafaxine XR (EFFEXOR-XR) 150 MG 24 hr capsule TAKE 1 CAPSULE BY MOUTH ONCE DAILY WITH BREAKFAST 09/09/20   Agapito GamesMetheney, Catherine D, MD    Allergies    Ace inhibitors, Atorvastatin, Metformin and related, Beta adrenergic blockers, and Glipizide  Review of Systems   Review of Systems  Constitutional: Negative for fever.  HENT: Negative for nosebleeds.   Eyes: Negative for pain and visual disturbance.  Respiratory: Negative for shortness of breath.   Cardiovascular: Negative for chest pain.  Gastrointestinal: Negative for abdominal pain, nausea and vomiting.  Genitourinary: Negative for flank pain.  Musculoskeletal: Negative for back pain and neck pain.  Skin: Negative for wound.  Neurological: Negative for weakness, numbness and headaches.  Hematological:       No anticoag use.   Psychiatric/Behavioral: Negative for confusion.    Physical Exam Updated Vital Signs BP (!) 159/73 (BP Location: Right Arm)   Pulse 80   Temp (!) 97.2 F (36.2 C) (Oral)   Resp 18   Ht 1.651 m (5\' 5" )   Wt 103.4 kg   LMP  (LMP Unknown)   SpO2 100%   BMI 37.94 kg/m   Physical Exam Vitals and nursing note reviewed.  Constitutional:      Appearance: Normal appearance. She is well-developed.  HENT:     Head: Atraumatic.     Nose: Nose normal.     Mouth/Throat:     Mouth: Mucous membranes are  moist.  Eyes:     General: No scleral icterus.    Conjunctiva/sclera: Conjunctivae normal.     Pupils: Pupils are equal, round, and reactive to light.  Neck:     Trachea: No tracheal deviation.  Cardiovascular:     Rate and Rhythm: Normal rate and regular rhythm.     Pulses: Normal pulses.     Heart sounds: Normal heart sounds. No murmur heard. No friction rub. No gallop.   Pulmonary:     Effort: Pulmonary effort is normal. No respiratory distress.     Breath sounds: Normal breath sounds.  Chest:     Chest wall: No tenderness.  Abdominal:     General: There is no distension.  Palpations: Abdomen is soft.     Tenderness: There is no abdominal tenderness.  Genitourinary:    Comments: No cva tenderness.  Musculoskeletal:        General: No swelling.     Cervical back: Normal range of motion and neck supple. No rigidity or tenderness. No muscular tenderness.     Comments: CTLS spine, non tender, aligned, no step off. STS and tenderness left wrist and lateral hand, no focal scaphoid tenderness.  Otherwise, good rom bil ext without other focal pain or bony tenderness. Radial pulse 2+. Normal cap refill in fingers. Pt able to flex and extend digits. Skin intact.   Skin:    General: Skin is warm and dry.     Findings: No rash.  Neurological:     Mental Status: She is alert.     Comments: Alert, speech normal. GCS 15. Motor/sens grossly intact bil. Steady gait. Left hand nvi.   Psychiatric:        Mood and Affect: Mood normal.     ED Results / Procedures / Treatments   Labs (all labs ordered are listed, but only abnormal results are displayed) Labs Reviewed - No data to display  EKG None  Radiology DG Wrist Complete Left  Result Date: 11/02/2020 CLINICAL DATA:  Posttraumatic hand and wrist pain related to fall yesterday EXAM: LEFT WRIST - COMPLETE 3+ VIEW COMPARISON:  None. FINDINGS: There is no evidence of fracture or dislocation. There is no evidence of arthropathy or  other focal bone abnormality. Soft tissues are unremarkable. IMPRESSION: Negative. Electronically Signed   By: Marnee Spring M.D.   On: 11/02/2020 10:11   DG Hand Complete Left  Result Date: 11/02/2020 CLINICAL DATA:  Posttraumatic hand pain related to fall yesterday EXAM: LEFT HAND - COMPLETE 3+ VIEW COMPARISON:  None. FINDINGS: Dorsal soft tissue swelling. At the dorsal head neck junction of the fifth metacarpal there is mild cortical irregularity on the oblique view, indeterminate. IMPRESSION: 1. Slight cortical irregularity at the head neck junction of the fifth metacarpal, please correlate for focal tenderness. 2. Soft tissue swelling. Electronically Signed   By: Marnee Spring M.D.   On: 11/02/2020 10:12    Procedures Procedures   Medications Ordered in ED Medications  HYDROmorphone (DILAUDID) injection 1 mg (has no administration in time range)    ED Course  I have reviewed the triage vital signs and the nursing notes.  Pertinent labs & imaging results that were available during my care of the patient were reviewed by me and considered in my medical decision making (see chart for details).    MDM Rules/Calculators/A&P                         Imaging studies ordered. Icepack to sore area. Elevate.   Reviewed nursing notes and prior charts for additional history.   Dilaudid 1 mg im for pain.  Xrays reviewed/interpreted by me - ?possible 5th mc fx.   Pain improved.   Ulnar gutter splint applied.   Given bony distal 5th MC and distal radius tenderness, imaging - will have f/u ortho in coming week. Discussed w pt left MC fracture, and possibility of occult injury/fx distal radius, scaphoid, and/or ligamentous injury.   Return precautions provided.      Final Clinical Impression(s) / ED Diagnoses Final diagnoses:  None    Rx / DC Orders ED Discharge Orders    None  Cathren Laine, MD 11/02/20 1047

## 2020-11-02 NOTE — ED Triage Notes (Signed)
Pt fell last night fell on concrete left arm and wrist pain, did not pass out, skin warm and dry.  Left had bruising

## 2020-11-02 NOTE — Discharge Instructions (Addendum)
It was our pleasure to provide your ER care today - we hope that you feel better.  Your xray show a probable fracture of the left 5th metacarpal bone.  It is also possible there is occult injury to your wrist (distal radius, scaphoid, or ligament injury).   Wear splint. Elevate wrist/hand. Icepack/cold to sore area. Follow up with orthopedist in the coming week - call office this AM to arrange appointment time.   You may take ibuprofen or acetaminophen as need for pain. You may also take your oxycodone as need for pain - no driving for the next 6 hours or when taking oxycodone.   Return to ER if worse, new symptoms, severe/intractable pain, numbness/weakness, severe headache, weak/faint, or other concern.

## 2020-11-05 ENCOUNTER — Telehealth: Payer: Self-pay

## 2020-11-05 NOTE — Telephone Encounter (Signed)
Transition Care Management Unsuccessful Follow-up Telephone Call  Date of discharge and from where:  11/02/2020 from [facility]  Attempts:  1st Attempt  Reason for unsuccessful TCM follow-up call:  Left voice message

## 2020-11-06 NOTE — Telephone Encounter (Signed)
Transition Care Management Unsuccessful Follow-up Telephone Call  Date of discharge and from where:  11/02/2020 from [facility]  Attempts:  2nd Attempt  Reason for unsuccessful TCM follow-up call:  Left voice message

## 2020-11-07 NOTE — Telephone Encounter (Signed)
Transition Care Management Unsuccessful Follow-up Telephone Call  Date of discharge and from where:  11/02/2020 from [facility]  Attempts:  3rd Attempt  Reason for unsuccessful TCM follow-up call:  Unable to reach patient

## 2020-11-11 ENCOUNTER — Encounter: Payer: Self-pay | Admitting: Family Medicine

## 2020-11-13 ENCOUNTER — Encounter: Payer: Self-pay | Admitting: Physician Assistant

## 2020-11-13 ENCOUNTER — Telehealth (INDEPENDENT_AMBULATORY_CARE_PROVIDER_SITE_OTHER): Payer: No Typology Code available for payment source | Admitting: Physician Assistant

## 2020-11-13 VITALS — BP 138/74 | Temp 99.4°F | Ht 65.0 in | Wt 217.0 lb

## 2020-11-13 DIAGNOSIS — J40 Bronchitis, not specified as acute or chronic: Secondary | ICD-10-CM | POA: Diagnosis not present

## 2020-11-13 DIAGNOSIS — J329 Chronic sinusitis, unspecified: Secondary | ICD-10-CM | POA: Diagnosis not present

## 2020-11-13 MED ORDER — ALBUTEROL SULFATE HFA 108 (90 BASE) MCG/ACT IN AERS: 2.0000 | INHALATION_SPRAY | Freq: Four times a day (QID) | RESPIRATORY_TRACT | 0 refills | Status: DC | PRN

## 2020-11-13 MED ORDER — AZITHROMYCIN 250 MG PO TABS: ORAL_TABLET | ORAL | 0 refills | Status: DC

## 2020-11-13 MED ORDER — PREDNISONE 50 MG PO TABS: ORAL_TABLET | ORAL | 0 refills | Status: DC

## 2020-11-13 NOTE — Progress Notes (Signed)
Patient ID: Samantha [name], female   DOB: 1965-11-27, 55 y.o.   MRN: [id number] .Marland KitchenVirtual Visit via Telephone Note  I connected with Samantha [name] on 11/13/20 at  8:30 AM EDT by telephone and verified that I am speaking with the correct person using two identifiers.  Location: Patient: home Provider: clinic  .Marland KitchenParticipating in visit:  Patient: Samantha [name] Provider: Tandy Gaw PA-C   I discussed the limitations, risks, security and privacy concerns of performing an evaluation and management service by telephone and the availability of in person appointments. I also discussed with the patient that there may be a patient responsible charge related to this service. The patient expressed understanding and agreed to proceed.  Present Illness: Patient is a 55 year old obese female with type 2 diabetes, hypertension, hx of stroke who calls into the clinic with over 7 days of sinus congestion, pressure, headache and worsening shortness of breath, productive cough, productive sputum.  She is ran a low-grade fever of 99 to 100.  She has felt like she has had some chills at night.  She is fully Covid vaccinated with no known Covid exposure.  She has had 2 - Covid test at this point.  She is a current smoker.  She has not been diagnosed with COPD and she does not have a rescue inhaler or a daily inhaler.  She does have a history of bronchitis a few times a year.  She has been taking over-the-counter cold and cough preparations with minimal benefit.  .. Active Ambulatory Problems    Diagnosis Date Noted  . Type II diabetes mellitus (HCC) 11/22/2018  . Seizure as late effect of cerebrovascular accident (CVA) (HCC) 11/22/2018  . Cerebrovascular accident (CVA) due to occlusion of precerebral artery (HCC) 11/22/2018  . Essential hypertension 11/22/2018  . Vitamin D deficiency 11/22/2018  . Chronic left-sided low back pain with left-sided sciatica 11/26/2018  . Class 2 severe obesity due to excess calories  with serious comorbidity and body mass index (BMI) of 37.0 to 37.9 in adult [facility]) 11/26/2018  . Mixed hyperlipidemia 11/26/2018  . B12 deficiency anemia 12/01/2018  . Chronic fatigue 12/01/2018  . GERD (gastroesophageal reflux disease) 12/01/2018  . Dysthymia 12/01/2018  . DDD (degenerative disc disease), lumbar 02/06/2019  . Spinal stenosis at L4-L5 level 02/09/2019  . Anterolisthesis 02/09/2019  . Depression, recurrent (HCC) 10/10/2019  . Insomnia 10/17/2019  . Urinary frequency 10/17/2019  . Edema 04/25/2017  . Mitral valve prolapse 04/24/2020  . Palpitations 04/24/2020  . Atypical chest pain 04/24/2020  . GAD (generalized anxiety disorder) 07/10/2020  . Tobacco abuse 07/20/2020   Resolved Ambulatory Problems    Diagnosis Date Noted  . Prediabetes 12/01/2018  . Cellulitis 04/25/2017   Past Medical History:  Diagnosis Date  . Anxiety   . Chronic bronchitis (HCC)   . Depression   . Diabetes mellitus without complication (HCC)   . Hypertension   . Seizures (HCC)   . Stroke [facility])    Reviewed med, allergy, problem list.     Observations/Objective: No acute distress Hoarse voice Productive cough No labored breathing or wheezing  .Marland Kitchen Today's Vitals   11/13/20 0809  BP: 138/74  Temp: 99.4 F (37.4 C)  TempSrc: Oral  Weight: 217 lb (98.4 kg)  Height: 5\' 5"  (1.651 m)   Body mass index is 36.11 kg/m.   Assessment and Plan: Marland KitchenAngelina was seen today for sore throat.  Diagnoses and all orders for this visit:  Sinobronchitis -     albuterol (VENTOLIN  HFA) 108 (90 Base) MCG/ACT inhaler; Inhale 2 puffs into the lungs every 6 (six) hours as needed. -     predniSONE (DELTASONE) 50 MG tablet; Take one tablet for 5 days. -     azithromycin (ZITHROMAX Z-PAK) 250 MG tablet; Take 2 tablets (500 mg) on  Day 1,  followed by 1 tablet (250 mg) once daily on Days 2 through 5.  Patient tested negative for Covid twice and she is fully vaccinated.  Patient is a current smoker  who declines smoking cessation he has had over a week ago sinus symptoms and worsening productive cough.  She has not been diagnosed with COPD however the symptoms do sound consistent with bronchitis and could mean some underlying COPD.  Will treat with Z-Pak, prednisone, albuterol inhaler.  Certainly allergies could be an instigator of the symptoms.  Consider an antihistamine during allergy season.  Follow-up as needed or symptoms do not improve or worsen.   Follow Up Instructions:    I discussed the assessment and treatment plan with the patient. The patient was provided an opportunity to ask questions and all were answered. The patient agreed with the plan and demonstrated an understanding of the instructions.   The patient was advised to call back or seek an in-person evaluation if the symptoms worsen or if the condition fails to improve as anticipated.  I provided 10 minutes of non-face-to-face time during this encounter.   Tandy Gaw, PA-C

## 2020-11-13 NOTE — Progress Notes (Signed)
Started 6-7 days ago Congestion/sneezing/coughing Feels like it is in her chest now Productive cough - brownish yellow  Smoker.

## 2020-11-16 ENCOUNTER — Telehealth: Payer: Self-pay

## 2020-11-16 DIAGNOSIS — E1165 Type 2 diabetes mellitus with hyperglycemia: Secondary | ICD-10-CM

## 2020-11-16 NOTE — Telephone Encounter (Signed)
PA sent for dapagliflozin propanediol (FARXIGA) 10 MG TABS tablet

## 2020-11-22 ENCOUNTER — Other Ambulatory Visit: Payer: Self-pay

## 2020-11-22 ENCOUNTER — Ambulatory Visit (INDEPENDENT_AMBULATORY_CARE_PROVIDER_SITE_OTHER): Payer: No Typology Code available for payment source

## 2020-11-22 ENCOUNTER — Other Ambulatory Visit: Payer: Self-pay | Admitting: Family Medicine

## 2020-11-22 DIAGNOSIS — Z1231 Encounter for screening mammogram for malignant neoplasm of breast: Secondary | ICD-10-CM

## 2020-11-27 MED ORDER — STEGLATRO 15 MG PO TABS: 15.0000 mg | ORAL_TABLET | Freq: Every day | ORAL | 3 refills | Status: DC

## 2020-11-27 NOTE — Telephone Encounter (Signed)
Spoke w/pt and she informed me that she hasn't gotten the farxiga 10 mg due to needing a PA for this.   Logged onto cover my meds and the PA was denied.   Spoke w/rep and was told to contact pt's pharmacy to verify that pt's information matches. Called [facility] and all of her information was the same. Called cover my meds back and advised them of this and was told to call the patient's plan.   Talked to [facility] at [facility] and she said that their plan is not affiliated with cover my meds. She spoke to the clinical pharmacist and they advised that Farxiga 10 mg is not covered however Stegaltro 15 mg is covered and that that strength is compatible to the farxiga 10 mg.   Pt's allergy list checked and this is not listed as an allergy for her. Will switch this to Stegaltro 15 mg and advise pt of change.

## 2020-11-27 NOTE — Telephone Encounter (Signed)
LVM advising pt of medication change.

## 2020-12-01 ENCOUNTER — Other Ambulatory Visit: Payer: Self-pay | Admitting: Physician Assistant

## 2020-12-01 DIAGNOSIS — R569 Unspecified convulsions: Secondary | ICD-10-CM

## 2020-12-01 DIAGNOSIS — I69398 Other sequelae of cerebral infarction: Secondary | ICD-10-CM

## 2021-01-18 ENCOUNTER — Other Ambulatory Visit: Payer: Self-pay | Admitting: Family Medicine

## 2021-01-18 DIAGNOSIS — F339 Major depressive disorder, recurrent, unspecified: Secondary | ICD-10-CM

## 2021-01-18 DIAGNOSIS — I10 Essential (primary) hypertension: Secondary | ICD-10-CM

## 2021-01-23 ENCOUNTER — Ambulatory Visit: Payer: No Typology Code available for payment source | Admitting: Cardiovascular Disease

## 2021-02-10 ENCOUNTER — Encounter: Payer: Self-pay | Admitting: Emergency Medicine

## 2021-02-10 ENCOUNTER — Other Ambulatory Visit: Payer: Self-pay

## 2021-02-10 ENCOUNTER — Ambulatory Visit
Admission: EM | Admit: 2021-02-10 | Discharge: 2021-02-10 | Disposition: A | Discharge status: Home / Self Care | Payer: No Typology Code available for payment source | Attending: Emergency Medicine | Admitting: Emergency Medicine

## 2021-02-10 DIAGNOSIS — Z20822 Contact with and (suspected) exposure to covid-19: Secondary | ICD-10-CM | POA: Diagnosis not present

## 2021-02-10 DIAGNOSIS — J0100 Acute maxillary sinusitis, unspecified: Secondary | ICD-10-CM | POA: Diagnosis not present

## 2021-02-10 MED ORDER — IBUPROFEN 600 MG PO TABS: 600.0000 mg | ORAL_TABLET | Freq: Four times a day (QID) | ORAL | 0 refills | Status: DC | PRN

## 2021-02-10 MED ORDER — BENZONATATE 200 MG PO CAPS: 200.0000 mg | ORAL_CAPSULE | Freq: Three times a day (TID) | ORAL | 0 refills | Status: DC | PRN

## 2021-02-10 MED ORDER — AMOXICILLIN-POT CLAVULANATE 875-125 MG PO TABS: 1.0000 | ORAL_TABLET | Freq: Two times a day (BID) | ORAL | 0 refills | Status: DC

## 2021-02-10 MED ORDER — FLUTICASONE PROPIONATE 50 MCG/ACT NA SUSP: 2.0000 | Freq: Every day | NASAL | 0 refills | Status: DC

## 2021-02-10 MED ORDER — HYDROCOD POLST-CPM POLST ER 10-8 MG/5ML PO SUER: 5.0000 mL | Freq: Two times a day (BID) | ORAL | 0 refills | Status: DC | PRN

## 2021-02-10 NOTE — Discharge Instructions (Addendum)
COVID, flu will be back in several days.  We will contact you if your COVID is positive.  You will be a candidate for antiviral treatment.  In the meantime, saline nasal irrigation with a Lloyd Huger Med rinse and distilled water as often as you want, Flonase, Mucinex, 600 mg of ibuprofen combined with 1000 mg of Tylenol together 3-4 times a day.  Tessalon for the cough during the day, Tussionex for the cough at night.  Finish the Augmentin unless a provider tells you to stop.  May take 2 puffs from your albuterol inhaler every 4-6 hours as needed for coughing.

## 2021-02-10 NOTE — ED Triage Notes (Signed)
Head congestion, cough, fever, and body aches since Friday.

## 2021-02-10 NOTE — ED Provider Notes (Signed)
HPI  SUBJECTIVE:  Samantha [name] is a 55 y.o. female who presents with 3 days of nasal congestion, postnasal drip, body aches, headaches, sinus pain and pressure, upper dental pain, green rhinorrhea and a cough productive of the same material as her nasal congestion.  States that she is unable to sleep at night secondary to the cough.  She ear pain, fevers above 100.4, facial swelling, wheezing, shortness of breath, loss of sense of smell or taste, nausea vomiting, diarrhea, abdominal pain.  No COVID or flu exposure.  She got the COVID booster and flu vaccine.  No antibiotics in the past month.  She took an antipyretic within 6 hours of evaluation.  She has been alternating 600 mg of ibuprofen with 500 mg of Tylenol with improvement in the body aches.  Cough is worse lying down.  She has a past medical history of diabetes, hypertension, CVA, seizures, smoking, COVID November 21.  No history of pulmonary disease.  [id number], Barbarann Ehlers, MD   Past Medical History:  Diagnosis Date   Anxiety    Chronic bronchitis (HCC)    Depression    Diabetes mellitus without complication (HCC)    GERD (gastroesophageal reflux disease)    Hypertension    Mitral valve prolapse    with regurgitation   Seizures (HCC)    Stroke [facility])     Past Surgical History:  Procedure Laterality Date   ABDOMINAL HYSTERECTOMY     CHOLECYSTECTOMY      Family History  Problem Relation Age of Onset   Hypertension Father    Heart attack Father    Stroke Father    Skin cancer Father    Breast cancer Paternal Grandmother    Heart attack Paternal Grandfather     Social History   Tobacco Use   Smoking status: Every Day    Packs/day: 1.00    Years: 25.00    Pack years: 25.00    Types: Cigarettes   Smokeless tobacco: Never  Vaping Use   Vaping Use: Never used  Substance Use Topics   Alcohol use: Not Currently   Drug use: Never    No current facility-administered medications for this encounter.  Current  Outpatient Medications:    amoxicillin-clavulanate (AUGMENTIN) 875-125 MG tablet, Take 1 tablet by mouth 2 (two) times daily. X 7 days, Disp: 14 tablet, Rfl: 0   benzonatate (TESSALON) 200 MG capsule, Take 1 capsule (200 mg total) by mouth 3 (three) times daily as needed for cough., Disp: 30 capsule, Rfl: 0   chlorpheniramine-HYDROcodone (TUSSIONEX PENNKINETIC ER) 10-8 MG/5ML SUER, Take 5 mLs by mouth every 12 (twelve) hours as needed for cough., Disp: 60 mL, Rfl: 0   fluticasone (FLONASE) 50 MCG/ACT nasal spray, Place 2 sprays into both nostrils daily., Disp: 16 g, Rfl: 0   ibuprofen (ADVIL) 600 MG tablet, Take 1 tablet (600 mg total) by mouth every 6 (six) hours as needed., Disp: 30 tablet, Rfl: 0   albuterol (VENTOLIN HFA) 108 (90 Base) MCG/ACT inhaler, Inhale 2 puffs into the lungs every 6 (six) hours as needed., Disp: 6.7 g, Rfl: 0   amLODipine (NORVASC) 10 MG tablet, Take 1 tablet by mouth once daily, Disp: 90 tablet, Rfl: 0   aspirin EC 81 MG tablet, Take 81 mg by mouth daily. , Disp: , Rfl:    clonazePAM (KLONOPIN) 0.5 MG tablet, Take 1 tablet (0.5 mg total) by mouth daily as needed for anxiety., Disp: 10 tablet, Rfl: 1   ertugliflozin L-PyroglutamicAc (STEGLATRO) 15  MG TABS tablet, Take 1 tablet (15 mg total) by mouth daily., Disp: 30 tablet, Rfl: 3   Evolocumab (REPATHA SURECLICK) 140 MG/ML SOAJ, Inject 140 mg into the skin every 14 (fourteen) days., Disp: 2 mL, Rfl: 11   levETIRAcetam (KEPPRA) 500 MG tablet, Take 1 tablet (500 mg total) by mouth daily., Disp: 90 tablet, Rfl: 1   mirtazapine (REMERON) 45 MG tablet, TAKE 1 TABLET BY MOUTH AT BEDTIME, Disp: 90 tablet, Rfl: 0   venlafaxine XR (EFFEXOR-XR) 150 MG 24 hr capsule, TAKE 1 CAPSULE BY MOUTH ONCE DAILY WITH BREAKFAST, Disp: 90 capsule, Rfl: 0   Vitamin D, Ergocalciferol, (DRISDOL) 1.25 MG (50000 UNIT) CAPS capsule, Take 1 capsule by mouth once a week, Disp: 12 capsule, Rfl: 0  Allergies  Allergen Reactions   Ace Inhibitors Swelling    Atorvastatin     myalgia   Metformin And Related Other (See Comments)    Diarrhea   Beta Adrenergic Blockers Other (See Comments)    Low heart rate   Glipizide Nausea Only     ROS  As noted in HPI.   Physical Exam  BP 135/84 (BP Location: Right Arm)   Pulse 76   Temp 98.3 F (36.8 C) (Oral)   Resp 16   LMP  (LMP Unknown)   SpO2 97%   Constitutional: Well developed, well nourished, no acute distress.  Coughing Eyes:  EOMI, conjunctiva normal bilaterally HENT: Normocephalic, atraumatic,mucus membranes moist.  Positive purulent nasal congestion.  Erythematous, swollen turbinates.  Mild maxillary sinus tenderness.  Erythematous oropharynx.  Normal tonsils without exudates.  Uvula midline.  Positive postnasal drip. Neck: No cervical lymphadenopathy Respiratory: Normal inspiratory effort, lungs clear bilaterally Cardiovascular: Normal rate regular rhythm, no murmurs rubs or gallops GI: nondistended skin: No rash, skin intact Musculoskeletal: no deformities Neurologic: Alert & oriented x 3, no focal neuro deficits Psychiatric: Speech and behavior appropriate   ED Course   Medications - No data to display  Orders Placed This Encounter  Procedures   Covid-19, Flu A+B ([facility])    Standing Status:   Standing    Number of Occurrences:   1   Results for orders placed or performed during the hospital encounter of 02/10/21  Covid-19, Flu A+B ([facility])   Specimen: Nasopharyngeal   Naso  Result Value Ref Range   SARS-CoV-2, NAA Not Detected Not Detected   Influenza A, NAA Not Detected Not Detected   Influenza B, NAA Not Detected Not Detected    No results found for this or any previous visit (from the past 24 hour(s)). No results found.  ED Clinical Impression  1. Acute non-recurrent maxillary sinusitis   2. Exposure to COVID-19 virus   3. Encounter for laboratory testing for COVID-19 virus      ED Assessment/Plan  [place] Narcotic database reviewed for this  patient, and feel that the risk/benefit ratio today is favorable for proceeding with a prescription for controlled substance.  Patient has oxycodone prescribed to her last on 5/21, but has been getting from a single source.  We will have her discontinue this if taking the Tussionex.  Patient with a sinusitis/URI.  COVID, flu sent.  She will be a candidate for antiviral treatment if Molnupiravir or Paxlovid if COVID comes back positive.  She is out of the window for treatment for flu.  Will send home with Augmentin for sinus infection due to the severity of her symptoms, saline nasal irrigation, Flonase, Mucinex, Tylenol/ibuprofen, Tessalon for the cough during the day, Tussionex  for the cough at night.  Follow-up with PMD as needed.  COVID, flu negative  Discussed labs, MDM, treatment plan, and plan for follow-up with patient. Discussed sn/sx that should prompt return to the ED. patient agrees with plan.   Meds ordered this encounter  Medications   fluticasone (FLONASE) 50 MCG/ACT nasal spray    Sig: Place 2 sprays into both nostrils daily.    Dispense:  16 g    Refill:  0   ibuprofen (ADVIL) 600 MG tablet    Sig: Take 1 tablet (600 mg total) by mouth every 6 (six) hours as needed.    Dispense:  30 tablet    Refill:  0   amoxicillin-clavulanate (AUGMENTIN) 875-125 MG tablet    Sig: Take 1 tablet by mouth 2 (two) times daily. X 7 days    Dispense:  14 tablet    Refill:  0   chlorpheniramine-HYDROcodone (TUSSIONEX PENNKINETIC ER) 10-8 MG/5ML SUER    Sig: Take 5 mLs by mouth every 12 (twelve) hours as needed for cough.    Dispense:  60 mL    Refill:  0   benzonatate (TESSALON) 200 MG capsule    Sig: Take 1 capsule (200 mg total) by mouth 3 (three) times daily as needed for cough.    Dispense:  30 capsule    Refill:  0      *This clinic note was created using Scientist, clinical (histocompatibility and immunogenetics). Therefore, there may be occasional mistakes despite careful proofreading.  ?    Domenick Gong, MD 02/13/21 1235

## 2021-02-12 LAB — COVID-19, FLU A+B NAA
Influenza A, NAA: NOT DETECTED
Influenza B, NAA: NOT DETECTED
SARS-CoV-2, NAA: NOT DETECTED

## 2021-04-11 ENCOUNTER — Ambulatory Visit
Admission: EM | Admit: 2021-04-11 | Discharge: 2021-04-11 | Disposition: A | Discharge status: Home / Self Care | Payer: 59 | Attending: Emergency Medicine | Admitting: Emergency Medicine

## 2021-04-11 ENCOUNTER — Other Ambulatory Visit: Payer: Self-pay

## 2021-04-11 ENCOUNTER — Encounter: Payer: Self-pay | Admitting: Emergency Medicine

## 2021-04-11 DIAGNOSIS — L02214 Cutaneous abscess of groin: Secondary | ICD-10-CM

## 2021-04-11 MED ORDER — DOXYCYCLINE HYCLATE 100 MG PO CAPS: 100.0000 mg | ORAL_CAPSULE | Freq: Two times a day (BID) | ORAL | 0 refills | Status: DC

## 2021-04-11 MED ORDER — FLUCONAZOLE 200 MG PO TABS: ORAL_TABLET | ORAL | 0 refills | Status: DC

## 2021-04-11 NOTE — ED Provider Notes (Signed)
[facility]   [id number] 04/11/21 Arrival Time: 0807   [id number]  SUBJECTIVE:  Samantha [name] is a 55 y.o. female who presents with a possible cyst to LT groin x 1 day.  Denies precipitating event or trauma.  Has tried warm compresses without relief.  Reports previous symptoms 3 months ago, with cyst draining on its own.  Complains of redness, swelling.  Denies fever, chills, nausea, vomiting.    ROS: As per HPI.  All other pertinent ROS negative.     Past Medical History:  Diagnosis Date   Anxiety    Chronic bronchitis (HCC)    Depression    Diabetes mellitus without complication (HCC)    GERD (gastroesophageal reflux disease)    Hypertension    Mitral valve prolapse    with regurgitation   Seizures (HCC)    Stroke [facility])    Past Surgical History:  Procedure Laterality Date   ABDOMINAL HYSTERECTOMY     CHOLECYSTECTOMY     Allergies  Allergen Reactions   Ace Inhibitors Swelling   Atorvastatin     myalgia   Metformin And Related Other (See Comments)    Diarrhea   Beta Adrenergic Blockers Other (See Comments)    Low heart rate   Glipizide Nausea Only   No current facility-administered medications on file prior to encounter.   Current Outpatient Medications on File Prior to Encounter  Medication Sig Dispense Refill   albuterol (VENTOLIN HFA) 108 (90 Base) MCG/ACT inhaler Inhale 2 puffs into the lungs every 6 (six) hours as needed. 6.7 g 0   amLODipine (NORVASC) 10 MG tablet Take 1 tablet by mouth once daily 90 tablet 0   aspirin EC 81 MG tablet Take 81 mg by mouth daily.      chlorpheniramine-HYDROcodone (TUSSIONEX PENNKINETIC ER) 10-8 MG/5ML SUER Take 5 mLs by mouth every 12 (twelve) hours as needed for cough. 60 mL 0   clonazePAM (KLONOPIN) 0.5 MG tablet Take 1 tablet (0.5 mg total) by mouth daily as needed for anxiety. 10 tablet 1   ertugliflozin L-PyroglutamicAc (STEGLATRO) 15 MG TABS tablet Take 1 tablet (15 mg total) by mouth daily. 30 tablet 3    Evolocumab (REPATHA SURECLICK) 140 MG/ML SOAJ Inject 140 mg into the skin every 14 (fourteen) days. 2 mL 11   fluticasone (FLONASE) 50 MCG/ACT nasal spray Place 2 sprays into both nostrils daily. 16 g 0   ibuprofen (ADVIL) 600 MG tablet Take 1 tablet (600 mg total) by mouth every 6 (six) hours as needed. 30 tablet 0   levETIRAcetam (KEPPRA) 500 MG tablet Take 1 tablet (500 mg total) by mouth daily. 90 tablet 1   mirtazapine (REMERON) 45 MG tablet TAKE 1 TABLET BY MOUTH AT BEDTIME 90 tablet 0   venlafaxine XR (EFFEXOR-XR) 150 MG 24 hr capsule TAKE 1 CAPSULE BY MOUTH ONCE DAILY WITH BREAKFAST 90 capsule 0   Vitamin D, Ergocalciferol, (DRISDOL) 1.25 MG (50000 UNIT) CAPS capsule Take 1 capsule by mouth once a week 12 capsule 0   Social History   Socioeconomic History   Marital status: Divorced    Spouse name: Not on file   Number of children: Not on file   Years of education: Not on file   Highest education level: Not on file  Occupational History   Not on file  Tobacco Use   Smoking status: Every Day    Packs/day: 1.00    Years: 25.00    Pack years: 25.00    Types: Cigarettes  Smokeless tobacco: Never  Vaping Use   Vaping Use: Never used  Substance and Sexual Activity   Alcohol use: Not Currently   Drug use: Never   Sexual activity: Not Currently    Partners: Male    Birth control/protection: Condom  Other Topics Concern   Not on file  Social History Narrative   Not on file   Social Determinants of Health   Financial Resource Strain: Not on file  Food Insecurity: Not on file  Transportation Needs: Not on file  Physical Activity: Not on file  Stress: Not on file  Social Connections: Not on file  Intimate Partner Violence: Not on file   Family History  Problem Relation Age of Onset   Hypertension Father    Heart attack Father    Stroke Father    Skin cancer Father    Breast cancer Paternal Grandmother    Heart attack Paternal Grandfather     OBJECTIVE:  Vitals:    04/11/21 0823  BP: (!) 155/88  Pulse: 76  Resp: 18  Temp: 98.5 F (36.9 C)  TempSrc: Oral  SpO2: 97%     General appearance: alert; no distress Skin: apx 2 cm induration of her LT groin; tender to touch; no active drainage, some bleeding Psychological: alert and cooperative; normal mood and affect  ASSESSMENT & PLAN:  1. Abscess of left groin     Meds ordered this encounter  Medications   doxycycline (VIBRAMYCIN) 100 MG capsule    Sig: Take 1 capsule (100 mg total) by mouth 2 (two) times daily.    Dispense:  20 capsule    Refill:  0    Order Specific Question:   Supervising Provider    Answer:   Eustace Moore [[id number]]   fluconazole (DIFLUCAN) 200 MG tablet    Sig: Take one dose by mouth, wait 72 hours, and then take second dose by mouth    Dispense:  2 tablet    Refill:  0    Order Specific Question:   Supervising Provider    Answer:   Eustace Moore [[id number]]   We will hold off on drainage today Apply warm compresses 3-4x daily for 10-15 minutes Wash site daily with warm water and mild soap Keep covered to avoid friction Take antibiotic as prescribed and to completion Follow up here or with PCP if symptoms persists Return or go to the ED if you have any new or worsening symptoms increased redness, swelling, pain, nausea, vomiting, fever, chills, etc...    Reviewed expectations re: course of current medical issues. Questions answered. Outlined signs and symptoms indicating need for more acute intervention. Patient verbalized understanding. After Visit Summary given.           [name], PA-C 04/11/21 [phone]

## 2021-04-11 NOTE — ED Triage Notes (Signed)
Cyst in left groin area since yesterday.  States she has been applying warm compresses without relief.  States she has had a cyst in that area 3 month that drained on its own.

## 2021-04-11 NOTE — Discharge Instructions (Signed)
We will hold off on drainage today Apply warm compresses 3-4x daily for 10-15 minutes Wash site daily with warm water and mild soap Keep covered to avoid friction Take antibiotic as prescribed and to completion Follow up here or with PCP if symptoms persists Return or go to the ED if you have any new or worsening symptoms increased redness, swelling, pain, nausea, vomiting, fever, chills, etc..Marland Kitchen

## 2021-05-01 ENCOUNTER — Encounter: Payer: Self-pay | Admitting: Family Medicine

## 2021-05-02 ENCOUNTER — Encounter: Payer: Self-pay | Admitting: Family Medicine

## 2021-05-27 ENCOUNTER — Other Ambulatory Visit: Payer: Self-pay | Admitting: Physician Assistant

## 2021-05-27 ENCOUNTER — Other Ambulatory Visit: Payer: Self-pay | Admitting: Family Medicine

## 2021-05-27 DIAGNOSIS — I69398 Other sequelae of cerebral infarction: Secondary | ICD-10-CM

## 2021-05-27 DIAGNOSIS — F339 Major depressive disorder, recurrent, unspecified: Secondary | ICD-10-CM

## 2021-05-27 DIAGNOSIS — I10 Essential (primary) hypertension: Secondary | ICD-10-CM

## 2021-05-27 DIAGNOSIS — R569 Unspecified convulsions: Secondary | ICD-10-CM

## 2021-06-03 ENCOUNTER — Ambulatory Visit: Payer: 59 | Admitting: Family Medicine

## 2021-07-24 ENCOUNTER — Telehealth: Payer: Self-pay

## 2021-07-24 NOTE — Telephone Encounter (Signed)
Called and lmomed the pt that I need drug insurance for repatha because what they pharmacy has and what I have on file is old

## 2021-08-26 IMAGING — XA Imaging study
2 series · 2 of 2 positions shown · non-contrast
Comparison: none

CLINICAL DATA: Lumbosacral spondylosis without myelopathy. Spinal
stenosis at L4-5. Good response to the previous epidural injection
with recurrence of low back and bilateral buttock pain.

[Series 1: ortho adipose · 1 of 1 slices shown (1 of 2)]
[im 1/1]
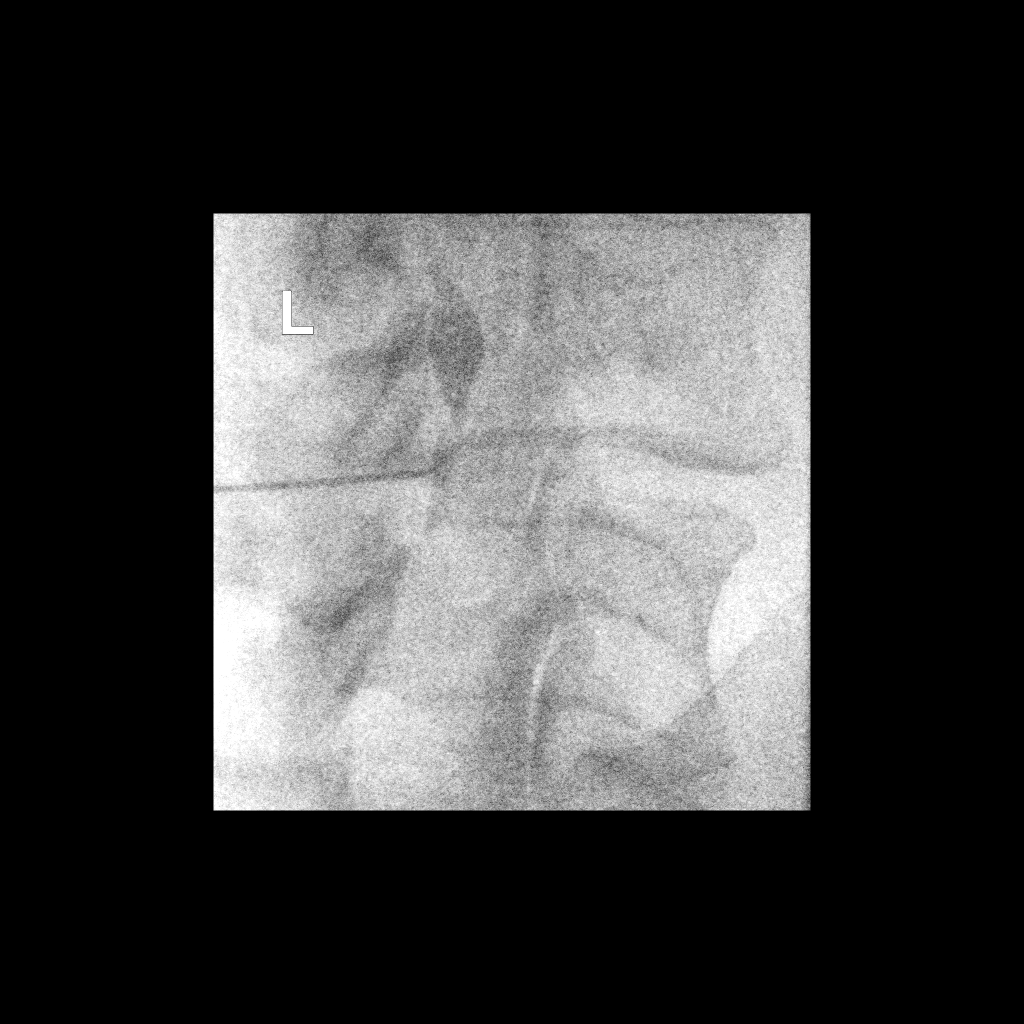

[Series 2: ortho adipose · 1 of 1 slices shown (2 of 2)]
[im 1/1]
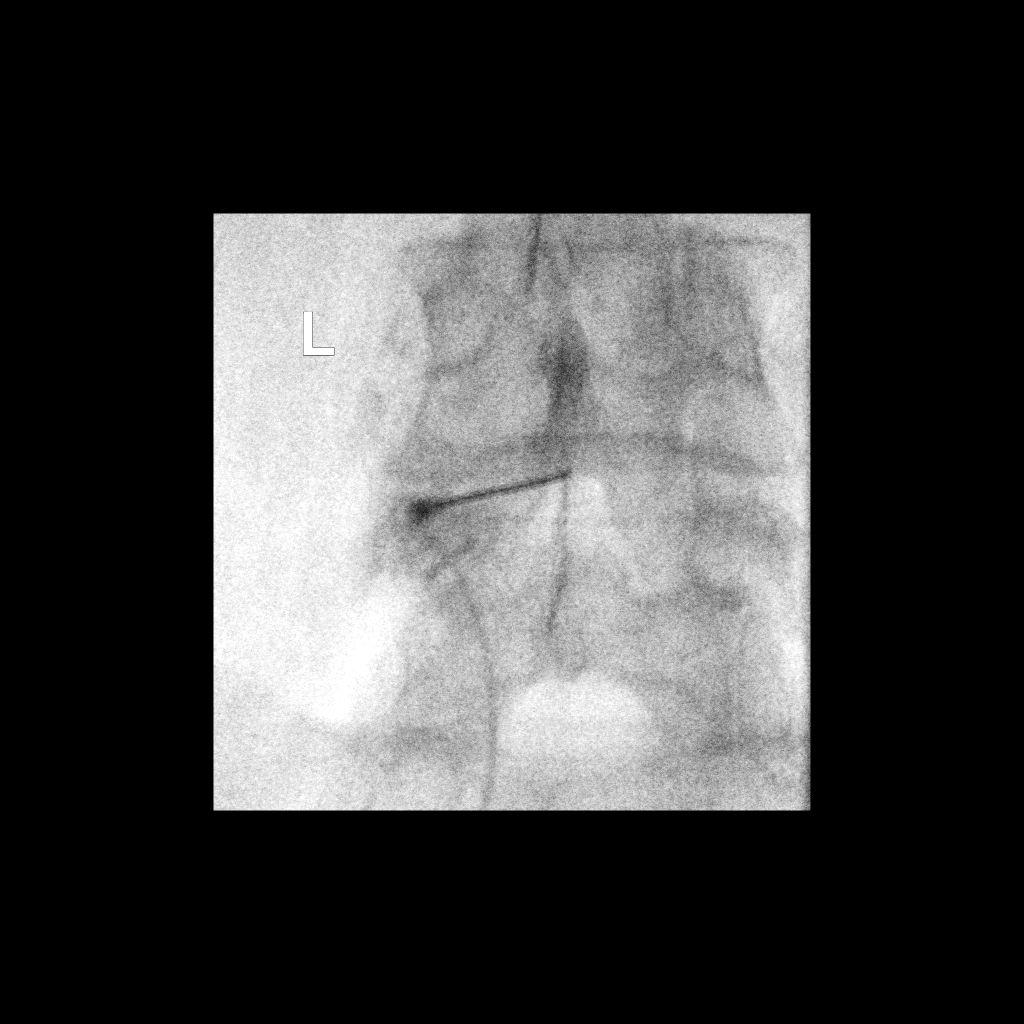

[2 of 2 positions shown; findings below may reference images not displayed]

FLUOROSCOPY TIME:  Fluoroscopy Time: 6 seconds

Radiation Exposure Index: 19.76 microGray*m^2

PROCEDURE:
The procedure, risks, benefits, and alternatives were explained to
the patient. Questions regarding the procedure were encouraged and
answered. The patient understands and consents to the procedure.

LUMBAR EPIDURAL INJECTION:

An interlaminar approach was performed on the left at L4-5. The
overlying skin was cleansed and anesthetized. A 3.5 inch 20 gauge
epidural needle was advanced using loss-of-resistance technique.

DIAGNOSTIC EPIDURAL INJECTION:

Injection of Isovue-M 200 shows a good epidural pattern with spread
above and below the level of needle placement, primarily on the
left. No vascular opacification is seen.

THERAPEUTIC EPIDURAL INJECTION:

120 mg of Depo-Medrol mixed with 3 mL of 1% lidocaine were
instilled. The procedure was well-tolerated, and the patient was
discharged thirty minutes following the injection in good condition.

COMPLICATIONS:
None
IMPRESSION: Technically successful interlaminar epidural injection on the left
at L4-5.

## 2021-08-29 ENCOUNTER — Other Ambulatory Visit: Payer: Self-pay

## 2021-08-29 DIAGNOSIS — E782 Mixed hyperlipidemia: Secondary | ICD-10-CM

## 2021-09-17 ENCOUNTER — Other Ambulatory Visit: Payer: Self-pay

## 2021-09-17 ENCOUNTER — Ambulatory Visit (INDEPENDENT_AMBULATORY_CARE_PROVIDER_SITE_OTHER): Payer: No Typology Code available for payment source | Admitting: Family Medicine

## 2021-09-17 ENCOUNTER — Encounter: Payer: Self-pay | Admitting: Family Medicine

## 2021-09-17 VITALS — BP 162/68 | HR 65 | Resp 18 | Ht 65.0 in | Wt 202.0 lb

## 2021-09-17 DIAGNOSIS — R6881 Early satiety: Secondary | ICD-10-CM

## 2021-09-17 DIAGNOSIS — R1030 Lower abdominal pain, unspecified: Secondary | ICD-10-CM

## 2021-09-17 DIAGNOSIS — E1165 Type 2 diabetes mellitus with hyperglycemia: Secondary | ICD-10-CM

## 2021-09-17 DIAGNOSIS — R112 Nausea with vomiting, unspecified: Secondary | ICD-10-CM

## 2021-09-17 DIAGNOSIS — F339 Major depressive disorder, recurrent, unspecified: Secondary | ICD-10-CM

## 2021-09-17 DIAGNOSIS — R569 Unspecified convulsions: Secondary | ICD-10-CM

## 2021-09-17 DIAGNOSIS — I10 Essential (primary) hypertension: Secondary | ICD-10-CM | POA: Diagnosis not present

## 2021-09-17 DIAGNOSIS — R52 Pain, unspecified: Secondary | ICD-10-CM | POA: Diagnosis not present

## 2021-09-17 DIAGNOSIS — R1013 Epigastric pain: Secondary | ICD-10-CM

## 2021-09-17 DIAGNOSIS — M791 Myalgia, unspecified site: Secondary | ICD-10-CM

## 2021-09-17 DIAGNOSIS — Z6837 Body mass index (BMI) 37.0-37.9, adult: Secondary | ICD-10-CM

## 2021-09-17 DIAGNOSIS — E559 Vitamin D deficiency, unspecified: Secondary | ICD-10-CM | POA: Diagnosis not present

## 2021-09-17 DIAGNOSIS — R140 Abdominal distension (gaseous): Secondary | ICD-10-CM

## 2021-09-17 DIAGNOSIS — I69398 Other sequelae of cerebral infarction: Secondary | ICD-10-CM

## 2021-09-17 LAB — COMPLETE METABOLIC PANEL WITH GFR
AG Ratio: 1.5 (calc) (ref 1.0–2.5)
ALT: 50 U/L — ABNORMAL HIGH (ref 6–29)
AST: 35 U/L (ref 10–35)
Albumin: 4.4 g/dL (ref 3.6–5.1)
Alkaline phosphatase (APISO): 118 U/L (ref 37–153)
BUN: 10 mg/dL (ref 7–25)
CO2: 32 mmol/L (ref 20–32)
Calcium: 9.3 mg/dL (ref 8.6–10.4)
Chloride: 98 mmol/L (ref 98–110)
Creat: 0.56 mg/dL (ref 0.50–1.03)
Globulin: 2.9 g/dL (calc) (ref 1.9–3.7)
Glucose, Bld: 310 mg/dL — ABNORMAL HIGH (ref 65–99)
Potassium: 3.9 mmol/L (ref 3.5–5.3)
Sodium: 135 mmol/L (ref 135–146)
Total Bilirubin: 0.4 mg/dL (ref 0.2–1.2)
Total Protein: 7.3 g/dL (ref 6.1–8.1)
eGFR: 108 mL/min/{1.73_m2} (ref >=60)

## 2021-09-17 LAB — POCT URINALYSIS DIP (CLINITEK)
Bilirubin, UA: NEGATIVE
Glucose, UA: 250 mg/dL — AB
Nitrite, UA: NEGATIVE
POC PROTEIN,UA: 30 — AB
Spec Grav, UA: >=1.03 — AB (ref 1.010–1.025)
Urobilinogen, UA: 1 E.U./dL
pH, UA: 6 (ref 5.0–8.0)

## 2021-09-17 LAB — CBC WITH DIFFERENTIAL/PLATELET
Absolute Monocytes: 462 cells/uL (ref 200–950)
Basophils Absolute: 47 cells/uL (ref 0–200)
Basophils Relative: 0.7 %
Eosinophils Absolute: 80 cells/uL (ref 15–500)
Eosinophils Relative: 1.2 %
HCT: 44.9 % (ref 35.0–45.0)
Hemoglobin: 15.2 g/dL (ref 11.7–15.5)
Lymphs Abs: 2117 cells/uL (ref 850–3900)
MCH: 31.1 pg (ref 27.0–33.0)
MCHC: 33.9 g/dL (ref 32.0–36.0)
MCV: 92 fL (ref 80.0–100.0)
MPV: 12.4 fL (ref 7.5–12.5)
Monocytes Relative: 6.9 %
Neutro Abs: 3993 cells/uL (ref 1500–7800)
Neutrophils Relative %: 59.6 %
Platelets: 122 10*3/uL — ABNORMAL LOW (ref 140–400)
RBC: 4.88 10*6/uL (ref 3.80–5.10)
RDW: 12.1 % (ref 11.0–15.0)
Total Lymphocyte: 31.6 %
WBC: 6.7 10*3/uL (ref 3.8–10.8)

## 2021-09-17 LAB — POCT INFLUENZA A/B
Influenza A, POC: NEGATIVE
Influenza B, POC: NEGATIVE

## 2021-09-17 LAB — POCT GLYCOSYLATED HEMOGLOBIN (HGB A1C): Hemoglobin A1C: 10.5 % — AB (ref 4.0–5.6)

## 2021-09-17 LAB — LIPASE: Lipase: 43 U/L (ref 7–60)

## 2021-09-17 LAB — POCT UA - MICROALBUMIN
Creatinine, POC: 300 mg/dL
Microalbumin Ur, POC: 80 mg/L

## 2021-09-17 MED ORDER — ONDANSETRON HCL 4 MG PO TABS: 4.0000 mg | ORAL_TABLET | Freq: Three times a day (TID) | ORAL | 0 refills | Status: DC | PRN

## 2021-09-17 MED ORDER — OMEPRAZOLE 40 MG PO CPDR: 40.0000 mg | DELAYED_RELEASE_CAPSULE | Freq: Every day | ORAL | 1 refills | Status: DC

## 2021-09-17 MED ORDER — MECLIZINE HCL 25 MG PO CHEW: 1.0000 | CHEWABLE_TABLET | Freq: Two times a day (BID) | ORAL | 0 refills | Status: DC | PRN

## 2021-09-17 MED ORDER — PIOGLITAZONE HCL 30 MG PO TABS: 30.0000 mg | ORAL_TABLET | Freq: Every day | ORAL | 0 refills | Status: DC

## 2021-09-17 MED ORDER — VENLAFAXINE HCL ER 150 MG PO CP24: 150.0000 mg | ORAL_CAPSULE | Freq: Every day | ORAL | 1 refills | Status: DC

## 2021-09-17 MED ORDER — VITAMIN D (ERGOCALCIFEROL) 1.25 MG (50000 UNIT) PO CAPS: 50000.0000 [IU] | ORAL_CAPSULE | ORAL | 1 refills | Status: DC

## 2021-09-17 MED ORDER — AMLODIPINE BESYLATE 10 MG PO TABS: 10.0000 mg | ORAL_TABLET | Freq: Every day | ORAL | 1 refills | Status: DC

## 2021-09-17 NOTE — Assessment & Plan Note (Signed)
Start vitamin D supplement.  New prescription sent to pharmacy.

## 2021-09-17 NOTE — Assessment & Plan Note (Signed)
A1c uncontrolled at 10.5.  She would really like to get back on her Actos.  I think that is reasonable to start for now I think she would do great with a GLP-1 but being that she has significant nausea and intermittent vomiting right now I think we will hold off.

## 2021-09-17 NOTE — Assessment & Plan Note (Signed)
She has now been off her antiseizure medication for almost a year and has done well and has not had any recurrence of seizure in 3 years.

## 2021-09-17 NOTE — Progress Notes (Signed)
Established Patient Office Visit  Subjective:  Patient ID: Samantha [name], female    DOB: 12-Aug-1966  Age: 56 y.o. MRN: [id number]  CC:  Chief Complaint  Patient presents with   Body aches    Chills, 1 day.    Diabetes    Follow up. Patient has been off of Diabetes Medications for several months. Patient requesting to restart Actos.     HPI [name] presents for   Diabetes - no hypoglycemic events. No wounds or sores that are not healing well. No increased thirst or urination. Checking glucose at home.  She has been off her medications for probably almost a year.  She has noticed increased thirst.  Having abdominal pain especially after eating she will feel really bloated if she eats just a few bites or eats a lot.  In fact its bothered her enough that she is usually only eating once a day.  She is using some over-the-counter antacids but would like to have something prescription.  She is also been having some nausea and intermittent vomiting that seems to be more random.  That is been going on for at least 3 months.  Reflux has been worse lately.  Would like a prescription PPI.  Also starting yesterday she started feeling achy with muscle aches all over but worse in her legs.  She had a little bit of lower abdominal pain last night.  No constipation or diarrhea.  She also felt a little dizzy yesterday which she describes as vertigo she has had vertigo before.     Past Medical History:  Diagnosis Date   Anxiety    Chronic bronchitis (HCC)    Depression    Diabetes mellitus without complication (HCC)    GERD (gastroesophageal reflux disease)    Hypertension    Mitral valve prolapse    with regurgitation   Seizures (HCC)    Stroke [facility])     Past Surgical History:  Procedure Laterality Date   ABDOMINAL HYSTERECTOMY     CHOLECYSTECTOMY      Family History  Problem Relation Age of Onset   Hypertension Father    Heart attack Father    Stroke Father    Skin  cancer Father    Breast cancer Paternal Grandmother    Heart attack Paternal Grandfather     Social History   Socioeconomic History   Marital status: Divorced    Spouse name: Not on file   Number of children: Not on file   Years of education: Not on file   Highest education level: Not on file  Occupational History   Not on file  Tobacco Use   Smoking status: Every Day    Packs/day: 1.00    Years: 25.00    Pack years: 25.00    Types: Cigarettes   Smokeless tobacco: Never   Tobacco comments:    Less than a pack a day   Vaping Use   Vaping Use: Never used  Substance and Sexual Activity   Alcohol use: Not Currently   Drug use: Never   Sexual activity: Not Currently    Partners: Male    Birth control/protection: Condom  Other Topics Concern   Not on file  Social History Narrative   Not on file   Social Determinants of Health   Financial Resource Strain: Not on file  Food Insecurity: Not on file  Transportation Needs: Not on file  Physical Activity: Not on file  Stress: Not on file  Social Connections: Not on file  Intimate Partner Violence: Not on file    Outpatient Medications Prior to Visit  Medication Sig Dispense Refill   aspirin EC 81 MG tablet Take 81 mg by mouth daily.      mirtazapine (REMERON) 45 MG tablet TAKE 1 TABLET BY MOUTH AT BEDTIME 90 tablet 0   oxyCODONE-acetaminophen (PERCOCET) 10-325 MG tablet Take 1 tablet by mouth 4 (four) times daily as needed.     venlafaxine XR (EFFEXOR-XR) 150 MG 24 hr capsule TAKE 1 CAPSULE BY MOUTH ONCE DAILY WITH BREAKFAST 90 capsule 0   amLODipine (NORVASC) 10 MG tablet Take 1 tablet by mouth once daily 90 tablet 0   albuterol (VENTOLIN HFA) 108 (90 Base) MCG/ACT inhaler Inhale 2 puffs into the lungs every 6 (six) hours as needed. 6.7 g 0   clonazePAM (KLONOPIN) 0.5 MG tablet Take 1 tablet (0.5 mg total) by mouth daily as needed for anxiety. 10 tablet 1   ertugliflozin L-PyroglutamicAc (STEGLATRO) 15 MG TABS tablet  Take 1 tablet (15 mg total) by mouth daily. (Patient not taking: Reported on 09/17/2021) 30 tablet 3   Evolocumab (REPATHA SURECLICK) 299 MG/ML SOAJ Inject 140 mg into the skin every 14 (fourteen) days. (Patient not taking: Reported on 09/17/2021) 2 mL 11   fluticasone (FLONASE) 50 MCG/ACT nasal spray Place 2 sprays into both nostrils daily. (Patient not taking: Reported on 09/17/2021) 16 g 0   ibuprofen (ADVIL) 600 MG tablet Take 1 tablet (600 mg total) by mouth every 6 (six) hours as needed. (Patient not taking: Reported on 09/17/2021) 30 tablet 0   chlorpheniramine-HYDROcodone (TUSSIONEX PENNKINETIC ER) 10-8 MG/5ML SUER Take 5 mLs by mouth every 12 (twelve) hours as needed for cough. 60 mL 0   doxycycline (VIBRAMYCIN) 100 MG capsule Take 1 capsule (100 mg total) by mouth 2 (two) times daily. 20 capsule 0   fluconazole (DIFLUCAN) 200 MG tablet Take one dose by mouth, wait 72 hours, and then take second dose by mouth 2 tablet 0   levETIRAcetam (KEPPRA) 500 MG tablet Take 1 tablet by mouth twice daily (Patient not taking: Reported on 09/17/2021) 60 tablet 0   Vitamin D, Ergocalciferol, (DRISDOL) 1.25 MG (50000 UNIT) CAPS capsule Take 1 capsule by mouth once a week (Patient not taking: Reported on 09/17/2021) 12 capsule 0   No facility-administered medications prior to visit.    Allergies  Allergen Reactions   Ace Inhibitors Swelling   Atorvastatin     myalgia   Metformin And Related Other (See Comments)    Diarrhea   Beta Adrenergic Blockers Other (See Comments)    Low heart rate   Glipizide Nausea Only    ROS Review of Systems    Objective:    Physical Exam Constitutional:      Appearance: Normal appearance. She is well-developed.  HENT:     Head: Normocephalic and atraumatic.     Right Ear: Tympanic membrane, ear canal and external ear normal.     Left Ear: Tympanic membrane, ear canal and external ear normal.     Nose: Nose normal.     Mouth/Throat:     Mouth: Mucous membranes  are moist.     Pharynx: Oropharynx is clear. No oropharyngeal exudate or posterior oropharyngeal erythema.  Eyes:     Conjunctiva/sclera: Conjunctivae normal.     Pupils: Pupils are equal, round, and reactive to light.  Neck:     Thyroid: No thyromegaly.  Cardiovascular:     Rate and Rhythm:  Normal rate and regular rhythm.     Heart sounds: Normal heart sounds.  Pulmonary:     Effort: Pulmonary effort is normal.     Breath sounds: Normal breath sounds. No wheezing.  Abdominal:     General: There is no distension.     Tenderness: There is abdominal tenderness. There is no guarding.     Comments: TTP in the LLQ and RLQ.  She has som fullness in her epigastric area.    Musculoskeletal:     Cervical back: Neck supple.  Lymphadenopathy:     Cervical: No cervical adenopathy.  Skin:    General: Skin is warm and dry.  Neurological:     Mental Status: She is alert and oriented to person, place, and time.  Psychiatric:        Behavior: Behavior normal.    BP (!) 162/68    Pulse 65    Resp 18    Ht _0  (1.651 m)    Wt 202 lb (91.6 kg)    LMP  (LMP Unknown)    SpO2 95%    BMI 33.61 kg/m  Wt Readings from Last 3 Encounters:  09/17/21 202 lb (91.6 kg)  11/13/20 217 lb (98.4 kg)  11/02/20 228 lb (103.4 kg)     Health Maintenance Due  Topic Date Due   Pneumococcal Vaccine 64-64 Years old (1 - PCV) Never done   OPHTHALMOLOGY EXAM  Never done   HIV Screening  Never done   Hepatitis C Screening  Never done   TETANUS/TDAP  Never done   Zoster Vaccines- Shingrix (1 of 2) Never done   PAP SMEAR-Modifier  Never done   COLONOSCOPY (Pts 45-93yr Insurance coverage will need to be confirmed)  Never done   COVID-19 Vaccine (4 - Booster for Moderna series) 06/28/2020   FOOT EXAM  10/09/2020   INFLUENZA VACCINE  04/01/2021    There are no preventive care reminders to display for this patient.  Lab Results  Component Value Date   TSH 1.620 04/24/2020   Lab Results  Component Value  Date   WBC 6.7 09/17/2021   HGB 15.2 09/17/2021   HCT 44.9 09/17/2021   MCV 92.0 09/17/2021   PLT 122 (L) 09/17/2021   Lab Results  Component Value Date   NA 135 09/17/2021   K 3.9 09/17/2021   CO2 32 09/17/2021   GLUCOSE 310 (H) 09/17/2021   BUN 10 09/17/2021   CREATININE 0.56 09/17/2021   BILITOT 0.4 09/17/2021   ALKPHOS 105 04/24/2020   AST 35 09/17/2021   ALT 50 (H) 09/17/2021   PROT 7.3 09/17/2021   ALBUMIN 4.2 04/24/2020   CALCIUM 9.3 09/17/2021   ANIONGAP 12 03/26/2020   EGFR 108 09/17/2021   Lab Results  Component Value Date   CHOL 180 04/24/2020   Lab Results  Component Value Date   HDL 39 (L) 04/24/2020   Lab Results  Component Value Date   LDLCALC 122 (H) 04/24/2020   Lab Results  Component Value Date   TRIG 106 04/24/2020   Lab Results  Component Value Date   CHOLHDL 4.6 (H) 04/24/2020   Lab Results  Component Value Date   HGBA1C 10.5 (A) 09/17/2021      Assessment & Plan:   Problem List Items Addressed This Visit       Cardiovascular and Mediastinum   Essential hypertension    Uncontrolled today.  Restart blood pressure medication.  New prescription sent for amlodipine.  Relevant Medications   amLODipine (NORVASC) 10 MG tablet     Endocrine   Type II diabetes mellitus (HCC)    A1c uncontrolled at 10.5.  She would really like to get back on her Actos.  I think that is reasonable to start for now I think she would do great with a GLP-1 but being that she has significant nausea and intermittent vomiting right now I think we will hold off.      Relevant Medications   pioglitazone (ACTOS) 30 MG tablet   Other Relevant Orders   POCT HgB A1C (Completed)   POCT UA - Microalbumin (Completed)     Nervous and Auditory   Seizure as late effect of cerebrovascular accident (CVA) ([place])    She has now been off her antiseizure medication for almost a year and has done well and has not had any recurrence of seizure in 3 years.         Other   Vitamin D deficiency    Start vitamin D supplement.  New prescription sent to pharmacy.      Relevant Medications   Vitamin D, Ergocalciferol, (DRISDOL) 1.25 MG (50000 UNIT) CAPS capsule   Depression, recurrent (HCC)   Class 2 severe obesity due to excess calories with serious comorbidity and body mass index (BMI) of 37.0 to 37.9 in adult [facility])   Relevant Medications   pioglitazone (ACTOS) 30 MG tablet   Other Visit Diagnoses     Body aches    -  Primary   Relevant Orders   POCT Influenza A/B (Completed)   Nausea and vomiting, unspecified vomiting type       Relevant Orders   Ambulatory referral to Gastroenterology   CBC with Differential/Platelet (Completed)   COMPLETE METABOLIC PANEL WITH GFR (Completed)   Lipase (Completed)   POCT URINALYSIS DIP (CLINITEK) (Completed)   Urine Culture   CT Abdomen Pelvis W Contrast   Bloating       Relevant Orders   CBC with Differential/Platelet (Completed)   COMPLETE METABOLIC PANEL WITH GFR (Completed)   Lipase (Completed)   POCT URINALYSIS DIP (CLINITEK) (Completed)   CT Abdomen Pelvis W Contrast   Early satiety       Relevant Orders   CBC with Differential/Platelet (Completed)   COMPLETE METABOLIC PANEL WITH GFR (Completed)   Lipase (Completed)   CT Abdomen Pelvis W Contrast   Myalgia       Lower abdominal pain       Relevant Orders   CT Abdomen Pelvis W Contrast   Epigastric pain          Body aches/myalgias-unclear etiology.  She is afebrile here today.  Flu test was negative.  She also has very tender lower abdomen on the right lower quadrant and left lower quadrant.  Consider other causes such as UTI or diverticulitis.    Lower abdominal pain-did have her go ahead and do a urinalysis as well since she had some lower abdominal pain and tenderness.  Urinalysis did show some blood and trace leukocytes so we will send for culture.  Early satiety/bloating-could be secondary to some gastroparesis since she has had  uncontrolled diabetes for the last year and has been off of her medications we discussed the importance of try to get her back on track and hydrating well.  We will also start prescription dose PPI.  Prescription sent to pharmacy.  Also concerned about the firmness and fullness of the epigastric area.  Like to consider CT scan  after we get her renal function to evaluate for bloating, obstruction, mass.  Meds ordered this encounter  Medications   Vitamin D, Ergocalciferol, (DRISDOL) 1.25 MG (50000 UNIT) CAPS capsule    Sig: Take 1 capsule (50,000 Units total) by mouth once a week.    Dispense:  12 capsule    Refill:  1   amLODipine (NORVASC) 10 MG tablet    Sig: Take 1 tablet (10 mg total) by mouth daily.    Dispense:  90 tablet    Refill:  1   pioglitazone (ACTOS) 30 MG tablet    Sig: Take 1 tablet (30 mg total) by mouth daily.    Dispense:  90 tablet    Refill:  0   ondansetron (ZOFRAN) 4 MG tablet    Sig: Take 1 tablet (4 mg total) by mouth every 8 (eight) hours as needed for nausea or vomiting.    Dispense:  20 tablet    Refill:  0   Meclizine HCl 25 MG CHEW    Sig: Chew 1 tablet (25 mg total) by mouth 2 (two) times daily as needed.    Dispense:  30 tablet    Refill:  0   omeprazole (PRILOSEC) 40 MG capsule    Sig: Take 1 capsule (40 mg total) by mouth daily.    Dispense:  90 capsule    Refill:  1    Follow-up: No follow-ups on file.    Beatrice Lecher, MD

## 2021-09-17 NOTE — Assessment & Plan Note (Signed)
Uncontrolled today.  Restart blood pressure medication.  New prescription sent for amlodipine.

## 2021-09-17 NOTE — Progress Notes (Signed)
Hi [name], blood count looks okay as far as no sign of anemia or infection but your platelets are a little low which is unusual.  I definitely want to keep an eye on that and plan to recheck it again in about 2 weeks.  It could be indicative of a liver problem and one of your liver enzymes was mildly elevated.  Your blood glucose was over 300.  No sign of pancreatitis which is good I like to move forward with the CT scan now that we know your renal function.

## 2021-09-18 ENCOUNTER — Emergency Department (INDEPENDENT_AMBULATORY_CARE_PROVIDER_SITE_OTHER)
Admission: EM | Admit: 2021-09-18 | Discharge: 2021-09-18 | Disposition: A | Discharge status: Home / Self Care | Payer: No Typology Code available for payment source | Source: Home / Self Care

## 2021-09-18 ENCOUNTER — Other Ambulatory Visit: Payer: No Typology Code available for payment source

## 2021-09-18 ENCOUNTER — Telehealth: Payer: Self-pay | Admitting: Family Medicine

## 2021-09-18 ENCOUNTER — Other Ambulatory Visit: Payer: Self-pay

## 2021-09-18 ENCOUNTER — Emergency Department (INDEPENDENT_AMBULATORY_CARE_PROVIDER_SITE_OTHER): Payer: No Typology Code available for payment source

## 2021-09-18 ENCOUNTER — Other Ambulatory Visit: Payer: Self-pay | Admitting: Family Medicine

## 2021-09-18 DIAGNOSIS — R109 Unspecified abdominal pain: Secondary | ICD-10-CM | POA: Diagnosis not present

## 2021-09-18 DIAGNOSIS — R1084 Generalized abdominal pain: Secondary | ICD-10-CM | POA: Diagnosis not present

## 2021-09-18 DIAGNOSIS — D696 Thrombocytopenia, unspecified: Secondary | ICD-10-CM

## 2021-09-18 MED ORDER — IOHEXOL 300 MG/ML  SOLN
100.0000 mL | Freq: Once | INTRAMUSCULAR | Status: AC | PRN
  Administered 2021-09-18: 100 mL via INTRAVENOUS

## 2021-09-18 MED ORDER — ONDANSETRON HCL 8 MG PO TABS: 8.0000 mg | ORAL_TABLET | Freq: Three times a day (TID) | ORAL | 0 refills | Status: DC | PRN

## 2021-09-18 MED ORDER — ONDANSETRON 4 MG PO TBDP
4.0000 mg | ORAL_TABLET | Freq: Once | ORAL | Status: AC
  Administered 2021-09-18: 4 mg via ORAL

## 2021-09-18 NOTE — Progress Notes (Signed)
Called pt and reviewd results.  Strongly suspect cirrhosis with her history and low platelets. She doesn't use alcohol. Likely from fatty liver. Will need additional workup.  Will refer to GI urgently since she is symptomatic.

## 2021-09-18 NOTE — Discharge Instructions (Addendum)
Advised patient of CT of abdomen with contrast results.  Patient provided hard copy of these results prior to discharge today.  Advised patient to follow-up with PCP for further evaluation.  Patient discharged home, hemodynamically stable.

## 2021-09-18 NOTE — Telephone Encounter (Signed)
Dr. Linford Arnold   They scheduled [name] for tomorrow 09/19/21 at 9:50 just wanted to give you a heads up.   Samantha [name]

## 2021-09-18 NOTE — ED Provider Notes (Signed)
[facility]    CSN: [id number] Arrival date & time: 09/18/21  1125      History   Chief Complaint Chief Complaint  Patient presents with   Abdominal Pain    Abdominal pain, nausea, and body aches. X3 days Off and on for 3 months    HPI Samantha [name] is a 56 y.o. female.   HPI 56 year old female presents with abdominal pain, nausea for 3 months worsening over the past 3 days.  Patient's PCP initially ordered stat CT of abdomen and pelvis with contrast.  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  Past Medical History:  Diagnosis Date   Anxiety    Chronic bronchitis ([place])    Depression    Diabetes mellitus without complication ([place])    GERD (gastroesophageal reflux disease)    Hypertension    Mitral valve prolapse    with regurgitation   Seizures ([place])    Stroke [facility])     Patient Active Problem List   Diagnosis Date Noted   Tobacco abuse 07/20/2020   GAD (generalized anxiety disorder) 07/10/2020   Mitral valve prolapse 04/24/2020   Palpitations 04/24/2020   Atypical chest pain 04/24/2020   Insomnia 10/17/2019   Urinary frequency 10/17/2019   Depression, recurrent ([place]) 10/10/2019   Spinal stenosis at L4-L5 level 02/09/2019   Anterolisthesis 02/09/2019   DDD (degenerative disc disease), lumbar 02/06/2019   B12 deficiency anemia 12/01/2018   Chronic fatigue 12/01/2018   GERD (gastroesophageal reflux disease) 12/01/2018   Dysthymia 12/01/2018   Chronic left-sided low back pain with left-sided sciatica 11/26/2018   Class 2 severe obesity due to excess calories with serious comorbidity and body mass index (BMI) of 37.0 to 37.9 in adult [facility]) 11/26/2018   Mixed hyperlipidemia 11/26/2018   Type II diabetes mellitus ([place]) 11/22/2018   Seizure as late effect of cerebrovascular accident (CVA) ([place]) 11/22/2018   Cerebrovascular accident (CVA) due to occlusion of precerebral artery ([place]) 11/22/2018    Essential hypertension 11/22/2018   Vitamin D deficiency 11/22/2018   Edema 04/25/2017    Past Surgical History:  Procedure Laterality Date   ABDOMINAL HYSTERECTOMY     CHOLECYSTECTOMY      OB History   No obstetric history on file.      Home Medications    Prior to Admission medications   Medication Sig Start Date End Date Taking? Authorizing Provider  amLODipine (NORVASC) 10 MG tablet Take 1 tablet (10 mg total) by mouth daily. 09/17/21  Yes Hali Marry, MD  aspirin EC 81 MG tablet Take 81 mg by mouth daily.    Yes [provider]  Evolocumab (REPATHA SURECLICK) XX123456 MG/ML SOAJ Inject 140 mg into the skin every 14 (fourteen) days. 07/23/20  Yes Lorretta Harp, MD  Meclizine HCl 25 MG CHEW Chew 1 tablet (25 mg total) by mouth 2 (two) times daily as needed. 09/17/21  Yes Hali Marry, MD  omeprazole (PRILOSEC) 40 MG capsule Take 1 capsule (40 mg total) by mouth daily. 09/17/21  Yes Hali Marry, MD  ondansetron (ZOFRAN) 4 MG tablet Take 1 tablet (4 mg total) by mouth every 8 (eight) hours as needed for nausea or vomiting. 09/17/21  Yes Hali Marry, MD  oxyCODONE-acetaminophen (PERCOCET) 10-325 MG tablet Take 1 tablet by mouth 4 (four) times daily as needed. 09/13/21  Yes [provider]  pioglitazone (ACTOS) 30 MG tablet Take 1 tablet (30 mg total) by mouth  daily. 09/17/21  Yes Hali Marry, MD  venlafaxine XR (EFFEXOR-XR) 150 MG 24 hr capsule Take 1 capsule (150 mg total) by mouth daily with breakfast. 09/17/21  Yes Hali Marry, MD  Vitamin D, Ergocalciferol, (DRISDOL) 1.25 MG (50000 UNIT) CAPS capsule Take 1 capsule (50,000 Units total) by mouth once a week. 09/17/21  Yes Hali Marry, MD    Family History Family History  Problem Relation Age of Onset   Hypertension Father    Heart attack Father    Stroke Father    Skin cancer Father    Breast cancer Paternal Grandmother    Heart attack Paternal  Grandfather     Social History Social History   Tobacco Use   Smoking status: Every Day    Packs/day: 1.00    Years: 25.00    Pack years: 25.00    Types: Cigarettes   Smokeless tobacco: Never   Tobacco comments:    Less than a pack a day   Vaping Use   Vaping Use: Never used  Substance Use Topics   Alcohol use: Not Currently   Drug use: Never     Allergies   Ace inhibitors, Atorvastatin, Metformin and related, Beta adrenergic blockers, and Glipizide   Review of Systems Review of Systems  Gastrointestinal:  Positive for abdominal pain and nausea.    Physical Exam Triage Vital Signs ED Triage Vitals  Enc Vitals Group     BP 09/18/21 1139 (!) 151/79     Pulse Rate 09/18/21 1139 63     Resp 09/18/21 1139 18     Temp 09/18/21 1139 98.5 F (36.9 C)     Temp Source 09/18/21 1139 Oral     SpO2 09/18/21 1139 96 %     Weight 09/18/21 1138 200 lb (90.7 kg)     Height 09/18/21 1138 5' 4.5" (1.638 m)     Head Circumference --      Peak Flow --      Pain Score 09/18/21 1138 9     Pain Loc --      Pain Edu? --      Excl. in [place]? --    No data found.  Updated Vital Signs BP (!) 151/79 (BP Location: Left Arm)    Pulse 63    Temp 98.5 F (36.9 C) (Oral)    Resp 18    Ht 5' 4.5" (1.638 m)    Wt 200 lb (90.7 kg)    LMP  (LMP Unknown)    SpO2 96%    BMI 33.80 kg/m     Physical Exam Vitals and nursing note reviewed.  Constitutional:      General: She is not in acute distress.    Appearance: She is well-developed. She is obese. She is not ill-appearing.  HENT:     Mouth/Throat:     Mouth: Mucous membranes are moist.  Eyes:     Extraocular Movements: Extraocular movements intact.     Pupils: Pupils are equal, round, and reactive to light.  Cardiovascular:     Rate and Rhythm: Normal rate and regular rhythm.     Heart sounds: Normal heart sounds.  Pulmonary:     Effort: Pulmonary effort is normal.     Breath sounds: Normal breath sounds.  Abdominal:     General:  Abdomen is flat. Bowel sounds are absent. There is distension.     Palpations: Abdomen is soft. There is splenomegaly. There is no shifting dullness, fluid wave, hepatomegaly,  mass or pulsatile mass.     Tenderness: There is generalized abdominal tenderness and tenderness in the right upper quadrant, right lower quadrant, left upper quadrant and left lower quadrant. There is no right CVA tenderness, left CVA tenderness, guarding or rebound.  Skin:    General: Skin is warm and dry.  Neurological:     Mental Status: She is alert.     UC Treatments / Results  Labs (all labs ordered are listed, but only abnormal results are displayed) Labs Reviewed - No data to display  EKG   Radiology CT ABDOMEN PELVIS W CONTRAST  Result Date: 09/18/2021 CLINICAL DATA:  Abdominal pain EXAM: CT ABDOMEN AND PELVIS WITH CONTRAST TECHNIQUE: Multidetector CT imaging of the abdomen and pelvis was performed using the standard protocol following bolus administration of intravenous contrast. RADIATION DOSE REDUCTION: This exam was performed according to the departmental dose-optimization program which includes automated exposure control, adjustment of the mA and/or kV according to patient size and/or use of iterative reconstruction technique. CONTRAST:  143mL OMNIPAQUE IOHEXOL 300 MG/ML  SOLN COMPARISON:  None. FINDINGS: Lower chest: Unremarkable. Hepatobiliary: Liver measures 19 cm in length. There is fatty infiltration. There is minimal nodularity in the liver surface. Surgical clips are seen in gallbladder fossa. There is no dilation of bile ducts. Portal vein is patent. Pancreas: No focal abnormality is seen. Spleen: Spleen measures 16.4 cm in maximum diameter. Adrenals/Urinary Tract: Adrenals are not enlarged. There is no hydronephrosis. There are no renal or ureteral stones. Urinary bladder is not distended. Stomach/Bowel: Stomach is unremarkable. Small bowel loops are not dilated. Appendix is not dilated. There is  mild diffuse wall thickening in the ascending colon. There is no pericolic stranding or fluid collection. Vascular/Lymphatic: Scattered arterial calcifications are seen. Reproductive: Uterus is not seen.  There are no adnexal masses. Other: There is no ascites or pneumoperitoneum. Musculoskeletal: There is marked spinal stenosis at the L4-L5 level along with significant encroachment of neural foramina. There is minimal anterolisthesis at L4-L5 level. IMPRESSION: There is no evidence of intestinal obstruction or pneumoperitoneum. There is no hydronephrosis. Appendix is not dilated. Enlarged fatty liver. There is mild nodularity in the liver surface suggesting possible cirrhosis. Splenomegaly. Electronically Signed   By: Elmer Picker M.D.   On: 09/18/2021 12:52    Procedures Procedures (including critical care time)  Medications Ordered in UC Medications  ondansetron (ZOFRAN-ODT) disintegrating tablet 4 mg (4 mg Oral Given 09/18/21 1145)    Initial Impression / Assessment and Plan / UC Course  I have reviewed the triage vital signs and the nursing notes.  Pertinent labs & imaging results that were available during my care of the patient were reviewed by me and considered in my medical decision making (see chart for details).     MDM: 1.  Generalized abdominal pain: Advised patient of CT of abdomen with contrast results above.  Advised patient to follow-up with PCP for further evaluation.  Patient discharged home, hemodynamically stable. Final Clinical Impressions(s) / UC Diagnoses   Final diagnoses:  Generalized abdominal pain     Discharge Instructions      Advised patient of CT of abdomen with contrast results.  Patient provided hard copy of these results prior to discharge today.  Advised patient to follow-up with PCP for further evaluation.  Patient discharged home, hemodynamically stable.     ED Prescriptions   None    PDMP not reviewed this encounter.   Eliezer Lofts, [place] 09/18/21 1317

## 2021-09-18 NOTE — ED Triage Notes (Signed)
Pt left wallet in room while in CT. Wallet was walked down to CT with Pt's friend 1237 pm.

## 2021-09-18 NOTE — ED Triage Notes (Signed)
Pt states that she has some abdominal pain, nausea and body aches. X3 days  Pt states that she is vaccinated for covid.  Pt states that she has had flu vaccine.  Negative flu test 1/17. Negative covid test 1 week ago.

## 2021-09-19 ENCOUNTER — Ambulatory Visit (INDEPENDENT_AMBULATORY_CARE_PROVIDER_SITE_OTHER): Payer: No Typology Code available for payment source | Admitting: Gastroenterology

## 2021-09-19 ENCOUNTER — Other Ambulatory Visit (INDEPENDENT_AMBULATORY_CARE_PROVIDER_SITE_OTHER): Payer: No Typology Code available for payment source

## 2021-09-19 ENCOUNTER — Encounter: Payer: Self-pay | Admitting: Gastroenterology

## 2021-09-19 VITALS — BP 110/60 | HR 67 | Ht 64.5 in | Wt 201.0 lb

## 2021-09-19 DIAGNOSIS — R932 Abnormal findings on diagnostic imaging of liver and biliary tract: Secondary | ICD-10-CM

## 2021-09-19 DIAGNOSIS — R6881 Early satiety: Secondary | ICD-10-CM | POA: Diagnosis not present

## 2021-09-19 DIAGNOSIS — R112 Nausea with vomiting, unspecified: Secondary | ICD-10-CM

## 2021-09-19 DIAGNOSIS — R748 Abnormal levels of other serum enzymes: Secondary | ICD-10-CM

## 2021-09-19 DIAGNOSIS — R634 Abnormal weight loss: Secondary | ICD-10-CM

## 2021-09-19 DIAGNOSIS — R1013 Epigastric pain: Secondary | ICD-10-CM | POA: Diagnosis not present

## 2021-09-19 LAB — IBC + FERRITIN
Ferritin: 216.3 ng/mL (ref 10.0–291.0)
Iron: 76 ug/dL (ref 42–145)
Saturation Ratios: 19.5 % — ABNORMAL LOW (ref 20.0–50.0)
TIBC: 390.6 ug/dL (ref 250.0–450.0)
Transferrin: 279 mg/dL (ref 212.0–360.0)

## 2021-09-19 LAB — PROTIME-INR
INR: 1.1 ratio — ABNORMAL HIGH (ref 0.8–1.0)
Prothrombin Time: 11.6 s (ref 9.6–13.1)

## 2021-09-19 LAB — URINE CULTURE
MICRO NUMBER:: 12884585
SPECIMEN QUALITY:: ADEQUATE

## 2021-09-19 MED ORDER — METOCLOPRAMIDE HCL 5 MG PO TABS: 5.0000 mg | ORAL_TABLET | Freq: Three times a day (TID) | ORAL | 0 refills | Status: DC

## 2021-09-19 NOTE — Patient Instructions (Addendum)
If you are age 54 or older, your body mass index should be between 23-30. Your Body mass index is 33.97 kg/m. If this is out of the aforementioned range listed, please consider follow up with your Primary Care Provider.  If you are age 21 or younger, your body mass index should be between 19-25. Your Body mass index is 33.97 kg/m. If this is out of the aformentioned range listed, please consider follow up with your Primary Care Provider.   ________________________________________________________  The [facility] providers would like to encourage you to use [facility] to communicate with providers for non-urgent requests or questions.  Due to long hold times on the telephone, sending your provider a message by [facility] may be a faster and more efficient way to get a response.  Please allow 48 business hours for a response.  Please remember that this is for non-urgent requests.  _______________________________________________________  Samantha [name] have been scheduled for an endoscopy. Please follow written instructions given to you at your visit today. If you use inhalers (even only as needed), please bring them with you on the day of your procedure.  PLEASE STAY ON CLEAR LIQUIDS ALL DAY TODAY.  Please go to the lab in the basement of our building to have lab work done as you leave today. Hit "B" for basement when you get on the elevator.  When the doors open the lab is on your left.  We will call you with the results. Thank you.   We have sent the following medications to your pharmacy for you to pick up at your convenience: Reglan 5 mg: Take three times a day  Continue Prilosec 40 mg Once daily  Take Zofran every 8 hours.  Thank you for entrusting me with your care and for choosing [facility], Dr. Ileene Patrick

## 2021-09-19 NOTE — Progress Notes (Signed)
HPI :  [age] year old female with a history of diabetes, remote history of stroke and seizure disorder, history of anxiety, referred by Dr. Nani Gasseratherine Metheney for multiple upper tract symptoms and abnormal liver imaging.  She states for the past 3 months her stomach is really been bothering her.  It does not matter the portion size of what she eats, she has early satiety even after eating small volume meals.  She has been eating much less and has a poor appetite due to this.  She feels quite full and bloated which is associated with nausea.  She has had some spontaneous vomiting with the symptoms due to her discomfort.  She feels nauseated all the time.  She has some postprandial epigastric pain with this.  She is really not been eating well at all during this time and has lost about 40 pounds per her report.  She has been having some reflux manifesting as pyrosis often at night in light of the symptoms.  She has not been having any blood in her stool.  She denies any diarrhea or constipation or bowel changes otherwise.  She denies any NSAIDs.  She does have longstanding chronic back pain which is managed by chronic pain management and she is taking Percocet 3-4 times per day which she has been on for a long time.  She is also receiving injections into her back.  She does have a history of diabetes.  Her A1c has recently elevated to 10.5.  She was placed back on Actos this week to improve her glucose control.  She states she was just given some Prilosec 40 mg daily to start to treat the symptoms as well as was given Zofran which she has not yet started.  To further evaluate her symptoms she underwent a CT scan yesterday of her abdomen and pelvis with contrast.  There was no clear pathology on imaging to account for her symptoms.  Incidentally noted she has an enlarged fatty liver that is cirrhotic appearing and associated splenomegaly.  This is a new finding for her.  She does endorse a remote history of  fatty liver and years past, states she has had some elevations in her liver enzymes periodically.  Her father had alcoholic cirrhosis, she denies other family history of liver disease.  She herself does not drink any alcohol.  She does smoke cigarettes.  She is never had any jaundice, no ascites, no internal bleeding, no history of hepatic encephalopathy.  She denies any cardiopulmonary symptoms currently.  She has not had work-up for chronic liver disease given this is such a recent diagnosis.  She endorses a history of a stroke about 10 to 11 years ago and is maintained on aspirin for this.  During the time of her stroke she had associated seizures of new onset and those have been well controlled.  She has not had a seizure for at least 4 years and was tapered off of her seizure medication.  She has never had an EGD or colonoscopy.  Denies family history of colon cancer, gastric cancer, esophageal cancer.  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 She is rather anxious about the symptoms and wants to proceed with evaluation soon as possible given she feels so poorly.   CT abdomen / pelvis with contrast 09/18/2021 -  IMPRESSION: There is no evidence of intestinal obstruction or pneumoperitoneum. There is no hydronephrosis. Appendix is not dilated. Enlarged fatty liver. There is mild nodularity in the liver surface suggesting possible  cirrhosis. Splenomegaly. ADDENDUM: Following should be added to the impression. There is severe spinal stenosis along with encroachment of neural foramina at L4-L5 level  Echo 05/11/20 - EF 60-65%  Past Medical History:  Diagnosis Date   Anxiety    Chronic bronchitis (HCC)    Depression    Diabetes mellitus without complication (HCC)    GERD (gastroesophageal reflux disease)    Hypertension    Mitral valve prolapse    with regurgitation   Seizures (HCC)    Stroke [facility](HCC)      Past Surgical History:  Procedure Laterality Date   ABDOMINAL  HYSTERECTOMY     total, done laproscopic   APPENDECTOMY     CHOLECYSTECTOMY     Family History  Problem Relation Age of Onset   Hypertension Father    Heart attack Father    Stroke Father    Skin cancer Father    Liver disease Father    Breast cancer Paternal Grandmother    Diabetes Paternal Grandmother    Heart attack Paternal Grandfather    Colon cancer Neg Hx    Esophageal cancer Neg Hx    Social History   Tobacco Use   Smoking status: Every Day    Packs/day: 1.00    Years: 25.00    Pack years: 25.00    Types: Cigarettes   Smokeless tobacco: Never   Tobacco comments:    Less than a pack a day   Vaping Use   Vaping Use: Never used  Substance Use Topics   Alcohol use: Not Currently   Drug use: Never   Current Outpatient Medications  Medication Sig Dispense Refill   amLODipine (NORVASC) 10 MG tablet Take 1 tablet (10 mg total) by mouth daily. 90 tablet 1   aspirin EC 81 MG tablet Take 81 mg by mouth daily.      Meclizine HCl 25 MG CHEW Chew 1 tablet (25 mg total) by mouth 2 (two) times daily as needed. 30 tablet 0   omeprazole (PRILOSEC) 40 MG capsule Take 1 capsule (40 mg total) by mouth daily. 90 capsule 1   ondansetron (ZOFRAN) 8 MG tablet Take 1 tablet (8 mg total) by mouth every 8 (eight) hours as needed for nausea or vomiting. 30 tablet 0   oxyCODONE-acetaminophen (PERCOCET) 10-325 MG tablet Take 1 tablet by mouth 4 (four) times daily as needed.     pioglitazone (ACTOS) 30 MG tablet Take 1 tablet (30 mg total) by mouth daily. 90 tablet 0   venlafaxine XR (EFFEXOR-XR) 150 MG 24 hr capsule Take 1 capsule (150 mg total) by mouth daily with breakfast. 90 capsule 1   Vitamin D, Ergocalciferol, (DRISDOL) 1.25 MG (50000 UNIT) CAPS capsule Take 1 capsule (50,000 Units total) by mouth once a week. 12 capsule 1   No current facility-administered medications for this visit.   Allergies  Allergen Reactions   Ace Inhibitors Swelling   Atorvastatin     myalgia    Metformin And Related Other (See Comments)    Diarrhea   Beta Adrenergic Blockers Other (See Comments)    Low heart rate   Glipizide Nausea Only     Review of Systems: All systems reviewed and negative except where noted in HPI.    CT ABDOMEN PELVIS W CONTRAST  Addendum Date: 09/18/2021   ADDENDUM REPORT: 09/18/2021 14:27 ADDENDUM: Following should be added to the impression. There is severe spinal stenosis along with encroachment of neural foramina at L4-L5 level. Electronically Signed   By:  Ernie Avena M.D.   On: 09/18/2021 14:27   Result Date: 09/18/2021 CLINICAL DATA:  Abdominal pain EXAM: CT ABDOMEN AND PELVIS WITH CONTRAST TECHNIQUE: Multidetector CT imaging of the abdomen and pelvis was performed using the standard protocol following bolus administration of intravenous contrast. RADIATION DOSE REDUCTION: This exam was performed according to the departmental dose-optimization program which includes automated exposure control, adjustment of the mA and/or kV according to patient size and/or use of iterative reconstruction technique. CONTRAST:  OMNIPAQUE IOHEXOL 300 MG/ML  SOLN COMPARISON:  None. FINDINGS: Lower chest: Unremarkable. Hepatobiliary: Liver measures 19 cm in length. There is fatty infiltration. There is minimal nodularity in the liver surface. Surgical clips are seen in gallbladder fossa. There is no dilation of bile ducts. Portal vein is patent. Pancreas: No focal abnormality is seen. Spleen: Spleen measures 16.4 cm in maximum diameter. Adrenals/Urinary Tract: Adrenals are not enlarged. There is no hydronephrosis. There are no renal or ureteral stones. Urinary bladder is not distended. Stomach/Bowel: Stomach is unremarkable. Small bowel loops are not dilated. Appendix is not dilated. There is mild diffuse wall thickening in the ascending colon. There is no pericolic stranding or fluid collection. Vascular/Lymphatic: Scattered arterial calcifications are seen.  Reproductive: Uterus is not seen.  There are no adnexal masses. Other: There is no ascites or pneumoperitoneum. Musculoskeletal: There is marked spinal stenosis at the L4-L5 level along with significant encroachment of neural foramina. There is minimal anterolisthesis at L4-L5 level. IMPRESSION: There is no evidence of intestinal obstruction or pneumoperitoneum. There is no hydronephrosis. Appendix is not dilated. Enlarged fatty liver. There is mild nodularity in the liver surface suggesting possible cirrhosis. Splenomegaly. Electronically Signed: By: Ernie Avena M.D. On: 09/18/2021 12:52    Lab Results  Component Value Date   WBC 6.7 09/17/2021   HGB 15.2 09/17/2021   HCT 44.9 09/17/2021   MCV 92.0 09/17/2021   PLT 122 (L) 09/17/2021    Lab Results  Component Value Date   CREATININE 0.56 09/17/2021   BUN 10 09/17/2021   NA 135 09/17/2021   K 3.9 09/17/2021   CL 98 09/17/2021   CO2 32 09/17/2021    Lab Results  Component Value Date   ALT 50 (H) 09/17/2021   AST 35 09/17/2021   ALKPHOS 105 04/24/2020   BILITOT 0.4 09/17/2021      Physical Exam: BP 110/60    Pulse 67    Ht 5' 4.5" (1.638 m)    Wt 201 lb (91.2 kg)    LMP  (LMP Unknown)    BMI 33.97 kg/m  Constitutional: Pleasant,well-developed, female in no acute distress. HEENT: Normocephalic and atraumatic. Conjunctivae are normal. No scleral icterus. Neck supple.  Cardiovascular: Normal rate, regular rhythm.  Pulmonary/chest: Effort normal and breath sounds normal.  Abdominal: Soft, nondistended, mild epigastric TTP.  There are no masses palpable.  Extremities: no edema Lymphadenopathy: No cervical adenopathy noted. Neurological: Alert and oriented to person place and time. Skin: Skin is warm and dry. No rashes noted. Psychiatric: Normal mood and affect. Behavior is normal.   ASSESSMENT AND PLAN: 56 year old female referred here for new patient assessment following:  Nausea and vomiting Epigastric  pain Early satiety Weight loss Abnormal liver imaging Elevated liver enzymes  As above, 3 months worth of postprandial upper abdominal discomfort, early satiety, nausea vomiting and bloating.  This is really limited her p.o. intake and now losing weight.  Her CT scan is as above, no clear pathology to cause symptoms but incidentally noted to  have suspected cirrhosis.  We discussed differential diagnosis for symptoms.  Her diabetes control has worsened, A1c now over 10.  Quite possible she has underlying gastroparesis.  On top of this she has chronic pain in her back and is taking Percocet multiple times per day.  She previously tolerated this but if she has developed gastroparesis this could certainly be making things worse.  We discussed options.  In light of the symptoms and recommending an upper endoscopy to clear her stomach, ensure no outlet obstruction, rule out PUD, etc.  I discussed risks and benefits of the exam and anesthesia and she wants to proceed.  We had an opening to do this tomorrow and she wanted to proceed to soon as possible.  I asked her to stay on a clear liquid diet today in case she does have gastroparesis and we will give her some Reglan to use a few doses of 5 mg to see if that will help her symptoms as well.  Agree with trial Prilosec 40 mg daily for now she can increase to twice daily if needed, she can also take Zofran every 8 hours as needed for her nausea.  She has not tried taking it yet.  Further recommendations pending the results of her endoscopy.  If EGD is negative, assuming her symptoms are likely related to underlying gastroparesis.  Otherwise incidentally noted to have fatty liver with likely cirrhosis based on imaging.  She does have some mild thrombocytopenia.  We discussed the imaging findings, what cirrhosis is.  She is compensated if she does have cirrhosis.  I recommend full serologic evaluation to rule out other causes of underlying liver disease, she is  agreeable to this.  We will also check her immunity to hepatitis a and B and vaccinate if needed.  EGD will screen for varices.  We will need to perform HCC screening in 6 months, will check AFP.  She will see [place] every 6 months for this moving forward.  Of note, her colon was slightly thickened on the right side on CT scan.  She warrants a screening colonoscopy but would not tolerate prep right now.  We will focus on getting her upper tract symptoms improved and then she can return for screening colonoscopy at some point time when she is ready.  Plan: - EGD - tomorrow 9 AM - clear liquids all day today - Reglan 5mg  TID - 1 month supply, counseled on risks of tardive dyskinesia - start Prilosec 40mg  / day - start Zofran every 8 hours - work on diabetes control with PCP  - work on reduction of narcotic dosing. Ideally would like to avoid all narcotics in this situation but she can't function without them and will have a difficult time with this, will continue to see pain management - labs today for cirrhosis eval  - colonoscopy once she can tolerate a bowel prep  , MD [facility]  CC: , *

## 2021-09-19 NOTE — Progress Notes (Signed)
Hi [name], we did send her urine for culture because there were a few leukocytes..  Culture is negative so no sign of infection.

## 2021-09-20 ENCOUNTER — Other Ambulatory Visit: Payer: Self-pay

## 2021-09-20 ENCOUNTER — Ambulatory Visit (AMBULATORY_SURGERY_CENTER): Payer: No Typology Code available for payment source | Admitting: Gastroenterology

## 2021-09-20 ENCOUNTER — Encounter: Payer: Self-pay | Admitting: Gastroenterology

## 2021-09-20 VITALS — BP 144/90 | HR 70 | Temp 97.1°F | Resp 11 | Ht 64.0 in | Wt 201.0 lb

## 2021-09-20 DIAGNOSIS — R6881 Early satiety: Secondary | ICD-10-CM

## 2021-09-20 DIAGNOSIS — R634 Abnormal weight loss: Secondary | ICD-10-CM | POA: Diagnosis not present

## 2021-09-20 DIAGNOSIS — K31A0 Gastric intestinal metaplasia, unspecified: Secondary | ICD-10-CM | POA: Diagnosis not present

## 2021-09-20 DIAGNOSIS — R112 Nausea with vomiting, unspecified: Secondary | ICD-10-CM

## 2021-09-20 DIAGNOSIS — K2970 Gastritis, unspecified, without bleeding: Secondary | ICD-10-CM | POA: Diagnosis not present

## 2021-09-20 DIAGNOSIS — R1013 Epigastric pain: Secondary | ICD-10-CM

## 2021-09-20 DIAGNOSIS — K31A11 Gastric intestinal metaplasia without dysplasia, involving the antrum: Secondary | ICD-10-CM

## 2021-09-20 DIAGNOSIS — K2950 Unspecified chronic gastritis without bleeding: Secondary | ICD-10-CM

## 2021-09-20 MED ORDER — SODIUM CHLORIDE 0.9 % IV SOLN: 500.0000 mL | Freq: Once | INTRAVENOUS | Status: DC

## 2021-09-20 NOTE — Progress Notes (Signed)
History and Physical Interval Note: Patient seen yesterday, see full note for details. EGD to further evaluate symptoms as outlined below. She has taken some Reglan overnight and stayed on clear liquids and feeling somewhat better today. She denies any cardiopulmonary symptoms and exam unchanged since yesterday. WE discussed risks / benefits of EGD and anesthesia and she wishes to proceed.   09/20/2021 9:06 AM  Samantha [name]  has presented today for endoscopic procedure(s), with the diagnosis of  Encounter Diagnoses  Name Primary?   Nausea and vomiting, unspecified vomiting type Yes   Abdominal pain, epigastric    Early satiety    Loss of weight   .  The various methods of evaluation and treatment have been discussed with the patient and/or family. After consideration of risks, benefits and other options for treatment, the patient has consented to  the endoscopic procedure(s).   The patient's history has been reviewed, patient examined, no change in status, stable for surgery.  I have reviewed the patient's chart and labs.  Questions were answered to the patient's satisfaction.    Harlin Rain, MD [facility]

## 2021-09-20 NOTE — Op Note (Addendum)
[facility] Patient Name: Samantha [name] Procedure Date: 09/20/2021 9:02 AM MRN: [id number] Endoscopist: Remo Lipps P. Havery Moros , MD Age: 56 Referring MD:  Date of Birth: 1966-05-22 Gender: Female Account #: [id number] Procedure:                Upper GI endoscopy Indications:              Epigastric abdominal pain, Early satiety, Nausea                            with vomiting, Weight loss - history of diabetes                            A1c > 10, on chronic narcotics. Also with possible                            underlying cirrhosis on imaging. CT otherwise                            negative for cause of symptoms. Medicines:                Monitored Anesthesia Care Procedure:                Pre-Anesthesia Assessment:                           - Prior to the procedure, a History and Physical                            was performed, and patient medications and                            allergies were reviewed. The patient's tolerance of                            previous anesthesia was also reviewed. The risks                            and benefits of the procedure and the sedation                            options and risks were discussed with the patient.                            All questions were answered, and informed consent                            was obtained. Prior Anticoagulants: The patient has                            taken no previous anticoagulant or antiplatelet                            agents. ASA Grade Assessment: III - A patient with  severe systemic disease. After reviewing the risks                            and benefits, the patient was deemed in                            satisfactory condition to undergo the procedure.                           After obtaining informed consent, the endoscope was                            passed under direct vision. Throughout the                            procedure, the  patient's blood pressure, pulse, and                            oxygen saturations were monitored continuously. The                            [place]lin [phone] (loaner) was introduced                            through the mouth, and advanced to the second part                            of duodenum. The upper GI endoscopy was                            accomplished without difficulty. The patient                            tolerated the procedure well. Scope In: Scope Out: Findings:                 Esophagogastric landmarks were identified: the                            Z-line was found at 42 cm, the gastroesophageal                            junction was found at 42 cm and the upper extent of                            the gastric folds was found at 42 cm from the                            incisors.                           The exam of the esophagus was otherwise normal. No  esophageal varices                           Localized mild mucosal changes were found in the                            prepyloric region of the stomach, slightly altered                            mucosa likely benign focal hyperplastic changes.                            Biopsies were taken with a cold forceps for                            histology.                           The exam of the stomach was otherwise normal. NO                            outlet obstruction or gastric varices.                           Biopsies were taken with a cold forceps in the                            gastric body, at the incisura and in the gastric                            antrum for Helicobacter pylori testing.                           The duodenal bulb and second portion of the                            duodenum were normal. Complications:            No immediate complications. Estimated blood loss:                            Minimal. Estimated Blood Loss:     Estimated blood  loss was minimal. Impression:               - Esophagogastric landmarks identified.                           - Normal esophagus.                           - Mild mucosal changes in the prepyloric region of                            the stomach, suspect benign hyperplastic change.  Biopsied.                           - Normal stomach otherwise - biopsies taken to rule                            out H pylori                           - Normal duodenal bulb and second portion of the                            duodenum.                           No overt pathology noted to cause the patient's                            symptoms. Suspect patient may likely have                            underlying gastroparesis causing symptoms Recommendation:           - Patient has a contact number available for                            emergencies. The signs and symptoms of potential                            delayed complications were discussed with the                            patient. Return to normal activities tomorrow.                            Written discharge instructions were provided to the                            patient.                           - Resume previous diet.                           - Continue present medications including omeprazole                            once daily, Zofran scheduled a few times daily,                            Reglan 5mg  TID                           - Await pathology results and course on regimen as                            outlined                           -  Continue diabetes regimen to get better control                            of glucose levels                           - Try to minimize use of narcotics if possible Remo Lipps P. Deryl Giroux, MD 09/20/2021 9:26:10 AM This report has been signed electronically.

## 2021-09-20 NOTE — Progress Notes (Signed)
Called to room to assist during endoscopic procedure.  Patient ID and intended procedure confirmed with present staff. Received instructions for my participation in the procedure from the performing physician.  

## 2021-09-20 NOTE — Progress Notes (Signed)
Pt's states no medical or surgical changes since previsit or office visit.  VS SM 

## 2021-09-20 NOTE — Progress Notes (Signed)
Report given to PACU, vss 

## 2021-09-20 NOTE — Patient Instructions (Signed)
Await pathology results from the biopsies taken today.  Continue present medications, including omeprazole once a day, Zofran scheduled a few times each day, and Reglan 5 mg 3 times a day.  Continue diabetes regimen to get better control of glucose levels.  Try to minimize the use of narcotics if possible.   YOU HAD AN ENDOSCOPIC PROCEDURE TODAY AT THE [facility]:   Refer to the procedure report that was given to you for any specific questions about what was found during the examination.  If the procedure report does not answer your questions, please call your gastroenterologist to clarify.  If you requested that your care partner not be given the details of your procedure findings, then the procedure report has been included in a sealed envelope for you to review at your convenience later.  YOU SHOULD EXPECT: Some feelings of bloating in the abdomen. Passage of more gas than usual.  Walking can help get rid of the air that was put into your GI tract during the procedure and reduce the bloating. If you had a lower endoscopy (such as a colonoscopy or flexible sigmoidoscopy) you may notice spotting of blood in your stool or on the toilet paper. If you underwent a bowel prep for your procedure, you may not have a normal bowel movement for a few days.  Please Note:  You might notice some irritation and congestion in your nose or some drainage.  This is from the oxygen used during your procedure.  There is no need for concern and it should clear up in a day or so.  SYMPTOMS TO REPORT IMMEDIATELY:   Following upper endoscopy (EGD)  Vomiting of blood or coffee ground material  New chest pain or pain under the shoulder blades  Painful or persistently difficult swallowing  New shortness of breath  Fever of 100F or higher  Black, tarry-looking stools  For urgent or emergent issues, a gastroenterologist can be reached at any hour by calling [phone]. Do not use MyChart messaging  for urgent concerns.    DIET:  We do recommend a small meal at first, but then you may proceed to your regular diet.  Drink plenty of fluids but you should avoid alcoholic beverages for 24 hours.  ACTIVITY:  You should plan to take it easy for the rest of today and you should NOT DRIVE or use heavy machinery until tomorrow (because of the sedation medicines used during the test).    FOLLOW UP: Our staff will call the number listed on your records 48-72 hours following your procedure to check on you and address any questions or concerns that you may have regarding the information given to you following your procedure. If we do not reach you, we will leave a message.  We will attempt to reach you two times.  During this call, we will ask if you have developed any symptoms of COVID 19. If you develop any symptoms (ie: fever, flu-like symptoms, shortness of breath, cough etc.) before then, please call [phone].  If you test positive for Covid 19 in the 2 weeks post procedure, please call and report this information to [place].    If any biopsies were taken you will be contacted by phone or by letter within the next 1-3 weeks.  Please call us at [phone] if you have not heard about the biopsies in 3 weeks.    SIGNATURES/CONFIDENTIALITY: You and/or your care partner have signed paperwork which will be entered into your electronic  medical record.  These signatures attest to the fact that that the information above on your After Visit Summary has been reviewed and is understood.  Full responsibility of the confidentiality of this discharge information lies with you and/or your care-partner.

## 2021-09-24 ENCOUNTER — Telehealth: Payer: Self-pay

## 2021-09-24 NOTE — Telephone Encounter (Signed)
Left message on answering machine. 

## 2021-09-26 ENCOUNTER — Other Ambulatory Visit: Payer: Self-pay

## 2021-09-26 LAB — ANTI-SMOOTH MUSCLE ANTIBODY, IGG: Actin (Smooth Muscle) Antibody (IGG): <20 U (ref <20)

## 2021-09-26 LAB — MITOCHONDRIAL ANTIBODIES: Mitochondrial M2 Ab, IgG: <=20 U (ref <=20.0)

## 2021-09-26 LAB — HEPATITIS C ANTIBODY
Hepatitis C Ab: NONREACTIVE
SIGNAL TO CUT-OFF: 0.09 (ref <1.00)

## 2021-09-26 LAB — HEPATITIS A ANTIBODY, TOTAL: Hepatitis A AB,Total: NONREACTIVE

## 2021-09-26 LAB — HEPATITIS B SURFACE ANTIGEN: Hepatitis B Surface Ag: NONREACTIVE

## 2021-09-26 LAB — IGG: IgG (Immunoglobin G), Serum: 820 mg/dL (ref 600–1640)

## 2021-09-26 LAB — ANA: Anti Nuclear Antibody (ANA): NEGATIVE

## 2021-09-26 LAB — ALPHA-1-ANTITRYPSIN: A-1 Antitrypsin, Ser: 211 mg/dL — ABNORMAL HIGH (ref 83–199)

## 2021-09-26 LAB — CERULOPLASMIN: Ceruloplasmin: 37 mg/dL (ref 18–53)

## 2021-09-26 LAB — HEPATITIS B SURFACE ANTIBODY,QUALITATIVE: Hep B S Ab: REACTIVE — AB

## 2021-09-26 LAB — AFP TUMOR MARKER: AFP-Tumor Marker: 3.8 ng/mL

## 2021-09-26 NOTE — Progress Notes (Signed)
Hep A Vaccine order placed

## 2021-10-04 ENCOUNTER — Ambulatory Visit (INDEPENDENT_AMBULATORY_CARE_PROVIDER_SITE_OTHER): Payer: No Typology Code available for payment source | Admitting: Gastroenterology

## 2021-10-04 DIAGNOSIS — Z23 Encounter for immunization: Secondary | ICD-10-CM | POA: Diagnosis not present

## 2021-11-19 ENCOUNTER — Other Ambulatory Visit: Payer: Self-pay | Admitting: Gastroenterology

## 2021-11-29 ENCOUNTER — Ambulatory Visit: Payer: No Typology Code available for payment source | Admitting: Gastroenterology

## 2021-12-19 ENCOUNTER — Other Ambulatory Visit: Payer: Self-pay | Admitting: Gastroenterology

## 2021-12-21 ENCOUNTER — Other Ambulatory Visit: Payer: Self-pay | Admitting: Family Medicine

## 2021-12-21 DIAGNOSIS — E1165 Type 2 diabetes mellitus with hyperglycemia: Secondary | ICD-10-CM

## 2021-12-23 NOTE — Telephone Encounter (Signed)
Please call pt and advise her that she will need to schedule a f/u appt for DM and medication refills ?

## 2021-12-23 NOTE — Telephone Encounter (Signed)
LVM for patient to call back . AMUCK ?

## 2022-01-17 ENCOUNTER — Encounter: Payer: Self-pay | Admitting: Family Medicine

## 2022-02-21 ENCOUNTER — Encounter: Payer: Self-pay | Admitting: Family Medicine

## 2022-03-23 ENCOUNTER — Other Ambulatory Visit: Payer: Self-pay | Admitting: Family Medicine

## 2022-03-23 DIAGNOSIS — E1165 Type 2 diabetes mellitus with hyperglycemia: Secondary | ICD-10-CM

## 2022-03-25 ENCOUNTER — Telehealth: Payer: Self-pay | Admitting: Family Medicine

## 2022-03-25 ENCOUNTER — Ambulatory Visit: Payer: No Typology Code available for payment source | Admitting: Family Medicine

## 2022-03-25 DIAGNOSIS — E1165 Type 2 diabetes mellitus with hyperglycemia: Secondary | ICD-10-CM

## 2022-03-25 MED ORDER — PIOGLITAZONE HCL 30 MG PO TABS: 30.0000 mg | ORAL_TABLET | Freq: Every day | ORAL | 0 refills | Status: DC

## 2022-03-25 NOTE — Telephone Encounter (Signed)
Pt called. She is unable to attend her appointment because of her job.  She is requesting a refill on her Actos. She has rescheduled her appointment to July 28th.

## 2022-03-25 NOTE — Telephone Encounter (Signed)
Refill sent.

## 2022-03-28 ENCOUNTER — Encounter: Payer: Self-pay | Admitting: Family Medicine

## 2022-03-28 ENCOUNTER — Ambulatory Visit (INDEPENDENT_AMBULATORY_CARE_PROVIDER_SITE_OTHER): Payer: No Typology Code available for payment source | Admitting: Family Medicine

## 2022-03-28 VITALS — BP 130/87 | HR 78 | Ht 62.0 in | Wt 203.0 lb

## 2022-03-28 DIAGNOSIS — D518 Other vitamin B12 deficiency anemias: Secondary | ICD-10-CM

## 2022-03-28 DIAGNOSIS — I10 Essential (primary) hypertension: Secondary | ICD-10-CM | POA: Diagnosis not present

## 2022-03-28 DIAGNOSIS — E559 Vitamin D deficiency, unspecified: Secondary | ICD-10-CM | POA: Diagnosis not present

## 2022-03-28 DIAGNOSIS — E1165 Type 2 diabetes mellitus with hyperglycemia: Secondary | ICD-10-CM | POA: Diagnosis not present

## 2022-03-28 DIAGNOSIS — F339 Major depressive disorder, recurrent, unspecified: Secondary | ICD-10-CM

## 2022-03-28 DIAGNOSIS — R0789 Other chest pain: Secondary | ICD-10-CM

## 2022-03-28 LAB — POCT GLYCOSYLATED HEMOGLOBIN (HGB A1C): HbA1c POC (<> result, manual entry): 6.7 % (ref 4.0–5.6)

## 2022-03-28 LAB — POCT UA - MICROALBUMIN
Creatinine, POC: 300 mg/dL
Microalbumin Ur, POC: 80 mg/L

## 2022-03-28 MED ORDER — VITAMIN D (ERGOCALCIFEROL) 1.25 MG (50000 UNIT) PO CAPS: 50000.0000 [IU] | ORAL_CAPSULE | ORAL | 1 refills | Status: DC

## 2022-03-28 MED ORDER — PIOGLITAZONE HCL 30 MG PO TABS
30.0000 mg | ORAL_TABLET | Freq: Every day | ORAL | 1 refills | Status: DC
Start: 2022-03-28 — End: 2022-08-12

## 2022-03-28 MED ORDER — AMLODIPINE BESYLATE 10 MG PO TABS
10.0000 mg | ORAL_TABLET | Freq: Every day | ORAL | 1 refills | Status: DC
Start: 2022-03-28 — End: 2022-08-12

## 2022-03-28 MED ORDER — ONDANSETRON HCL 8 MG PO TABS
8.0000 mg | ORAL_TABLET | Freq: Three times a day (TID) | ORAL | 0 refills | Status: DC | PRN
Start: 2022-03-28 — End: 2022-08-26

## 2022-03-28 MED ORDER — VENLAFAXINE HCL ER 150 MG PO CP24: 150.0000 mg | ORAL_CAPSULE | Freq: Every day | ORAL | 1 refills | Status: DC

## 2022-03-28 NOTE — Assessment & Plan Note (Signed)
A1c looks fantastic today I think the changes that she was made look great.  She would like to continue with the Actos we will make sure that she has refills of sent to her mail order.  Plan to follow-up in 4 months.  She is due for some updated labs so we will print that out today as well.  Lab Results  Component Value Date   HGBA1C 10.5 (A) 09/17/2021

## 2022-03-28 NOTE — Assessment & Plan Note (Signed)
Well controlled. Continue current regimen. Follow up in  3-4 months.  

## 2022-03-28 NOTE — Assessment & Plan Note (Signed)
She is feels stable on her current regimen of Effexor.  We will make sure refills are sent to pharmacy.  Follow-up in 6 months.

## 2022-03-28 NOTE — Progress Notes (Unsigned)
Established Patient Office Visit  Subjective   Patient ID: Samantha [name], female    DOB: 01-Jan-1966  Age: 56 y.o. MRN: [id number]  Chief Complaint  Patient presents with   Follow-up    HPI  Diabetes - no hypoglycemic events. No wounds or sores that are not healing well. No increased thirst or urination. Checking glucose at home. Taking medications as prescribed without any side effects.  She is actually been out of medication for a couple of months now but she is really made some changes she is lost a lot of weight and she is really cut back on portions.  Hypertension- Pt denies chest pain, SOB, dizziness, or heart palpitations.  Taking meds as directed w/o problems.  Denies medication side effects.    She also reports an episode of chest pain that occurred about 2 weeks ago.  She got out today with her mom and they gone to the grocery store and she was putting some groceries in the car and after she was finished she came around the side of the car and had sudden left-sided chest pain.  She said she felt it several times later that evening.  She said she almost went to the emergency department but did not she did not feel lightheaded or dizzy with it.  The next day it seems to have resolved and she has not had a problem with it since then.  He also reports some medial left elbow pain especially if she puts her elbow in a certain positions where she is resting it on the counter.  No other trauma or injury but it just can feel sore and painful.  She needs most of her medications sent to her new mail order as they will no longer provide additional prescriptions at the local pharmacy.  Unfortunately her chronic back pain seems to be getting progressively worse.  They are talking about putting in a spinal stimulator.  She will undergo an external trial and they will also check with insurance to see if it will be covered.   {History (Optional):23778}  ROS    Objective:     BP 130/87    Pulse 78   Ht 5\' 2"  (1.575 m)   Wt 203 lb (92.1 kg)   LMP  (LMP Unknown)   SpO2 99%   BMI 37.13 kg/m  {Vitals History (Optional):23777}  Physical Exam Vitals and nursing note reviewed.  Constitutional:      Appearance: She is well-developed.  HENT:     Head: Normocephalic and atraumatic.  Cardiovascular:     Rate and Rhythm: Normal rate and regular rhythm.     Heart sounds: Normal heart sounds.  Pulmonary:     Effort: Pulmonary effort is normal.     Breath sounds: Normal breath sounds.  Musculoskeletal:     Comments: Tender over the left medial elbow but not directly over the medial malleolus.  We discussed just keeping pressure off of the area icing as needed and can use an anti-inflammatory if needed.  Skin:    General: Skin is warm and dry.  Neurological:     Mental Status: She is alert and oriented to person, place, and time.  Psychiatric:        Behavior: Behavior normal.      Results for orders placed or performed in visit on 03/28/22  POCT glycosylated hemoglobin (Hb A1C)  Result Value Ref Range   Hemoglobin A1C     HbA1c POC (<> result, manual entry)  6.7 4.0 - 5.6 %   HbA1c, POC (prediabetic range)     HbA1c, POC (controlled diabetic range)    POCT UA - Microalbumin  Result Value Ref Range   Microalbumin Ur, POC 80 mg/L   Creatinine, POC 300 mg/dL   Albumin/Creatinine Ratio, Urine, POC 30-300mg      {Labs (Optional):23779}  The ASCVD Risk score (Arnett DK, et al., 2019) failed to calculate for the following reasons:   The patient has a prior MI or stroke diagnosis    Assessment & Plan:   Problem List Items Addressed This Visit       Cardiovascular and Mediastinum   Essential hypertension    Well controlled. Continue current regimen. Follow up in  3-4 months.       Relevant Medications   amLODipine (NORVASC) 10 MG tablet   Other Relevant Orders   COMPLETE METABOLIC PANEL WITH GFR   Lipid Panel w/reflex Direct LDL     Endocrine   Type II  diabetes mellitus (HCC) - Primary    A1c looks fantastic today I think the changes that she was made look great.  She would like to continue with the Actos we will make sure that she has refills of sent to her mail order.  Plan to follow-up in 4 months.  She is due for some updated labs so we will print that out today as well.  Lab Results  Component Value Date   HGBA1C 10.5 (A) 09/17/2021         Relevant Medications   pioglitazone (ACTOS) 30 MG tablet   Other Relevant Orders   POCT glycosylated hemoglobin (Hb A1C) (Completed)   POCT UA - Microalbumin (Completed)   COMPLETE METABOLIC PANEL WITH GFR   Lipid Panel w/reflex Direct LDL   EKG 12-Lead     Other   Vitamin D deficiency   Relevant Medications   Vitamin D, Ergocalciferol, (DRISDOL) 1.25 MG (50000 UNIT) CAPS capsule   Other Relevant Orders   VITAMIN D 25 Hydroxy (Vit-D Deficiency, Fractures)   Depression, recurrent (HCC)    She is feels stable on her current regimen of Effexor.  We will make sure refills are sent to pharmacy.  Follow-up in 6 months.      Relevant Medications   venlafaxine XR (EFFEXOR-XR) 150 MG 24 hr capsule   B12 deficiency anemia   Relevant Orders   B12   Atypical chest pain    EKG today shows rate of 66 bpm, normal sinus rhythm with flipped T waves in lateral waves.    Return in about 4 months (around 07/29/2022) for Diabetes follow-up.    Nani Gasser, MD

## 2022-04-07 ENCOUNTER — Other Ambulatory Visit: Payer: Self-pay | Admitting: Family Medicine

## 2022-04-07 DIAGNOSIS — I10 Essential (primary) hypertension: Secondary | ICD-10-CM

## 2022-04-29 ENCOUNTER — Encounter (HOSPITAL_COMMUNITY): Payer: Self-pay

## 2022-04-29 ENCOUNTER — Ambulatory Visit (HOSPITAL_COMMUNITY)
Admission: EM | Admit: 2022-04-29 | Discharge: 2022-04-29 | Disposition: A | Discharge status: Home / Self Care | Payer: No Typology Code available for payment source | Attending: Family Medicine | Admitting: Family Medicine

## 2022-04-29 ENCOUNTER — Ambulatory Visit (INDEPENDENT_AMBULATORY_CARE_PROVIDER_SITE_OTHER): Payer: No Typology Code available for payment source

## 2022-04-29 DIAGNOSIS — J029 Acute pharyngitis, unspecified: Secondary | ICD-10-CM | POA: Diagnosis not present

## 2022-04-29 DIAGNOSIS — R509 Fever, unspecified: Secondary | ICD-10-CM | POA: Insufficient documentation

## 2022-04-29 DIAGNOSIS — R52 Pain, unspecified: Secondary | ICD-10-CM | POA: Insufficient documentation

## 2022-04-29 DIAGNOSIS — H6692 Otitis media, unspecified, left ear: Secondary | ICD-10-CM

## 2022-04-29 DIAGNOSIS — R059 Cough, unspecified: Secondary | ICD-10-CM

## 2022-04-29 DIAGNOSIS — Z20822 Contact with and (suspected) exposure to covid-19: Secondary | ICD-10-CM | POA: Insufficient documentation

## 2022-04-29 DIAGNOSIS — R051 Acute cough: Secondary | ICD-10-CM | POA: Diagnosis not present

## 2022-04-29 DIAGNOSIS — H6690 Otitis media, unspecified, unspecified ear: Secondary | ICD-10-CM | POA: Insufficient documentation

## 2022-04-29 LAB — SARS CORONAVIRUS 2 BY RT PCR: SARS Coronavirus 2 by RT PCR: NEGATIVE

## 2022-04-29 LAB — POCT RAPID STREP A, ED / UC: Streptococcus, Group A Screen (Direct): NEGATIVE

## 2022-04-29 MED ORDER — AMOXICILLIN-POT CLAVULANATE 875-125 MG PO TABS: 1.0000 | ORAL_TABLET | Freq: Two times a day (BID) | ORAL | 0 refills | Status: DC

## 2022-04-29 MED ORDER — PROMETHAZINE-DM 6.25-15 MG/5ML PO SYRP: 5.0000 mL | ORAL_SOLUTION | Freq: Four times a day (QID) | ORAL | 0 refills | Status: DC | PRN

## 2022-04-29 NOTE — ED Triage Notes (Signed)
Patient having fever, body aches, and sore throat. Onset Sunday night. Patient had a negative home COVID test yesterday. Highest fever at home was 102.4 F.   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   Patient took 500 mg tylenol and 400 ibuprofen this morning before coming in.

## 2022-04-29 NOTE — Discharge Instructions (Addendum)
You were seen today for upper respiratory symptoms.  Your strep test and chest xray were normal today.  Your covid swab is pending and will be resulted today.  If positive we can treat you with an antiviral.  In the mean time I have sent out an antibiotic for an ear infection, and given you cough syrup for your cough.  This could make you tired or drowsy.  You may take motrin for pain and fever, up to 800mg  three times/day.  You may take tylenol as well as your oxycodone you have at home.  Please get plenty of rest and fluids.

## 2022-04-29 NOTE — ED Provider Notes (Signed)
[facility]    CSN: [id number] Arrival date & time: 04/29/22  9937      History   Chief Complaint Chief Complaint  Patient presents with   Covid Exposure   Fever   Generalized Body Aches    HPI Samantha [name] is a 56 y.o. female.   Patient is here for URI symptoms.   Started Sunday 04/26/22 with just not feeling well.  Fever up to 102.4.  she is having sore throat, headache, left ear pain.  + runny nose, congestion, coughing all night.  No n/v.  No diarrhea.  She has been using motrin and tylenol.  She is a Engineer, civil (consulting) around covid patients.  She did a home covid which was negative.   Past Medical History:  Diagnosis Date   Anxiety    Chronic bronchitis (HCC)    Depression    Diabetes mellitus without complication (HCC)    GERD (gastroesophageal reflux disease)    Hypertension    Mitral valve prolapse    with regurgitation   Seizures (HCC)    Stroke [facility])     Patient Active Problem List   Diagnosis Date Noted   Tobacco abuse 07/20/2020   GAD (generalized anxiety disorder) 07/10/2020   Mitral valve prolapse 04/24/2020   Palpitations 04/24/2020   Atypical chest pain 04/24/2020   Insomnia 10/17/2019   Urinary frequency 10/17/2019   Depression, recurrent (HCC) 10/10/2019   Spinal stenosis at L4-L5 level 02/09/2019   Anterolisthesis 02/09/2019   DDD (degenerative disc disease), lumbar 02/06/2019   B12 deficiency anemia 12/01/2018   Chronic fatigue 12/01/2018   GERD (gastroesophageal reflux disease) 12/01/2018   Dysthymia 12/01/2018   Chronic left-sided low back pain with left-sided sciatica 11/26/2018   Class 2 severe obesity due to excess calories with serious comorbidity and body mass index (BMI) of 37.0 to 37.9 in adult [facility]) 11/26/2018   Mixed hyperlipidemia 11/26/2018   Type II diabetes mellitus (HCC) 11/22/2018   Seizure as late effect of cerebrovascular accident (CVA) (HCC) 11/22/2018   Cerebrovascular accident (CVA) due to occlusion of  precerebral artery (HCC) 11/22/2018   Essential hypertension 11/22/2018   Vitamin D deficiency 11/22/2018   Edema 04/25/2017    Past Surgical History:  Procedure Laterality Date   APPENDECTOMY     CHOLECYSTECTOMY     TOTAL ABDOMINAL HYSTERECTOMY      OB History   No obstetric history on file.      Home Medications    Prior to Admission medications   Medication Sig Start Date End Date Taking? Authorizing Provider  amLODipine (NORVASC) 10 MG tablet Take 1 tablet (10 mg total) by mouth daily. 03/28/22  Yes Agapito Games, MD  Meclizine HCl 25 MG CHEW Chew 1 tablet (25 mg total) by mouth 2 (two) times daily as needed. 09/17/21  Yes Agapito Games, MD  metoCLOPramide (REGLAN) 5 MG tablet Take 1 tablet (5 mg total) by mouth 3 (three) times daily before meals. 12/20/21  Yes Armbruster, Willaim Rayas, MD  omeprazole (PRILOSEC) 40 MG capsule Take 1 capsule (40 mg total) by mouth daily. 09/17/21  Yes Agapito Games, MD  oxyCODONE-acetaminophen (PERCOCET) 10-325 MG tablet Take 1 tablet by mouth 4 (four) times daily as needed. 09/13/21  Yes [provider]  pioglitazone (ACTOS) 30 MG tablet Take 1 tablet (30 mg total) by mouth daily. 03/28/22  Yes Agapito Games, MD  venlafaxine XR (EFFEXOR-XR) 150 MG 24 hr capsule Take 1 capsule (150 mg total) by mouth  daily with breakfast. 03/28/22  Yes Agapito Games, MD  Vitamin D, Ergocalciferol, (DRISDOL) 1.25 MG (50000 UNIT) CAPS capsule Take 1 capsule (50,000 Units total) by mouth once a week. 03/28/22  Yes Agapito Games, MD  aspirin EC 81 MG tablet Take 81 mg by mouth daily.     [provider]  ondansetron (ZOFRAN) 8 MG tablet Take 1 tablet (8 mg total) by mouth every 8 (eight) hours as needed for nausea or vomiting. 03/28/22   Agapito Games, MD    Family History Family History  Problem Relation Age of Onset   Hypertension Father    Heart attack Father    Stroke Father    Skin cancer Father     Liver disease Father    Breast cancer Paternal Grandmother    Diabetes Paternal Grandmother    Heart attack Paternal Grandfather    Colon cancer Neg Hx    Esophageal cancer Neg Hx     Social History Social History   Tobacco Use   Smoking status: Every Day    Packs/day: 1.00    Years: 25.00    Total pack years: 25.00    Types: Cigarettes   Smokeless tobacco: Never   Tobacco comments:    Less than a pack a day   Vaping Use   Vaping Use: Never used  Substance Use Topics   Alcohol use: Not Currently   Drug use: Never     Allergies   Ace inhibitors, Atorvastatin, Metformin and related, Beta adrenergic blockers, and Glipizide   Review of Systems Review of Systems  Constitutional:  Positive for chills, fatigue and fever.  HENT:  Positive for congestion, ear pain, rhinorrhea and sore throat.   Respiratory:  Positive for cough and wheezing. Negative for shortness of breath.   Cardiovascular: Negative.   Genitourinary: Negative.   Musculoskeletal: Negative.      Physical Exam Triage Vital Signs ED Triage Vitals  Enc Vitals Group     BP 04/29/22 0907 (!) 146/81     Pulse Rate 04/29/22 0907 85     Resp 04/29/22 0907 16     Temp 04/29/22 0907 (!) 101.6 F (38.7 C)     Temp Source 04/29/22 0907 Oral     SpO2 04/29/22 0907 100 %     Weight 04/29/22 0910 200 lb (90.7 kg)     Height 04/29/22 0910 5' 4.5" (1.638 m)     Head Circumference --      Peak Flow --      Pain Score 04/29/22 0909 10     Pain Loc --      Pain Edu? --      Excl. in GC? --    No data found.  Updated Vital Signs BP (!) 146/81 (BP Location: Left Arm)   Pulse 85   Temp (!) 101.6 F (38.7 C) (Oral)   Resp 16   Ht 5' 4.5" (1.638 m)   Wt 90.7 kg   LMP  (LMP Unknown)   SpO2 100%   BMI 33.80 kg/m   Visual Acuity Right Eye Distance:   Left Eye Distance:   Bilateral Distance:    Right Eye Near:   Left Eye Near:    Bilateral Near:     Physical Exam Constitutional:      General: She  is not in acute distress.    Appearance: Normal appearance. She is ill-appearing.  HENT:     Head: Normocephalic and atraumatic.  Right Ear: Tympanic membrane normal.     Left Ear: Tympanic membrane is injected and erythematous.     Mouth/Throat:     Pharynx: Posterior oropharyngeal erythema present. No pharyngeal swelling or oropharyngeal exudate.     Tonsils: No tonsillar exudate.  Cardiovascular:     Rate and Rhythm: Normal rate and regular rhythm.  Pulmonary:     Effort: Pulmonary effort is normal.     Breath sounds: Normal breath sounds.  Musculoskeletal:        General: Normal range of motion.     Cervical back: Normal range of motion and neck supple. Tenderness present.  Skin:    General: Skin is warm.  Neurological:     General: No focal deficit present.     Mental Status: She is alert.  Psychiatric:        Mood and Affect: Mood normal.      UC Treatments / Results  Labs (all labs ordered are listed, but only abnormal results are displayed) Labs Reviewed - No data to display  EKG   Radiology No results found.  Procedures Procedures (including critical care time)  Medications Ordered in UC Medications - No data to display  Initial Impression / Assessment and Plan / UC Course  I have reviewed the triage vital signs and the nursing notes.  Pertinent labs & imaging results that were available during my care of the patient were reviewed by me and considered in my medical decision making (see chart for details).   Patient was seen today for URI symptoms.  Covid swab today.  If positive would start molnupiravir for covid treatment.  Precautions given today.  Treated for cough and aom.  Recommend motrin for pain and fever.   Final Clinical Impressions(s) / UC Diagnoses   Final diagnoses:  Fever, unspecified fever cause  Acute cough  Sore throat  Acute otitis media, unspecified otitis media type  Body aches     Discharge Instructions      You  were seen today for upper respiratory symptoms.  Your strep test and chest xray were normal today.  Your covid swab is pending and will be resulted today.  If positive we can treat you with an antiviral.  In the mean time I have sent out an antibiotic for an ear infection, and given you cough syrup for your cough.  This could make you tired or drowsy.  You may take motrin for pain and fever, up to 800mg  three times/day.  You may take tylenol as well as your oxycodone you have at home.  Please get plenty of rest and fluids.     ED Prescriptions     Medication Sig Dispense Auth. Provider   amoxicillin-clavulanate (AUGMENTIN) 875-125 MG tablet Take 1 tablet by mouth every 12 (twelve) hours. 14 tablet Keiran Sias, MD   promethazine-dextromethorphan (PROMETHAZINE-DM) 6.25-15 MG/5ML syrup Take 5 mLs by mouth 4 (four) times daily as needed for cough. 118 mL 11-03-1990, MD      PDMP not reviewed this encounter.   Jannifer Franklin, MD 04/29/22 1017

## 2022-05-01 LAB — CULTURE, GROUP A STREP (THRC)

## 2022-05-11 ENCOUNTER — Other Ambulatory Visit: Payer: Self-pay | Admitting: Family Medicine

## 2022-05-11 DIAGNOSIS — F339 Major depressive disorder, recurrent, unspecified: Secondary | ICD-10-CM

## 2022-07-31 ENCOUNTER — Ambulatory Visit: Payer: No Typology Code available for payment source | Admitting: Family Medicine

## 2022-08-06 IMAGING — CT CT CARDIAC CORONARY ARTERY CALCIUM SCORE
3 series · 14 of 20 positions shown, 15 images · non-contrast
Comparison: None.
COMPARISON: None.

Addendum:
EXAM:
OVER-READ INTERPRETATION  CT CHEST

The following report is an over-read performed by radiologist Dr.
Kaki Jim [REDACTED] on 05/11/2020. This
over-read does not include interpretation of cardiac or coronary
anatomy or pathology. The coronary calcium score interpretation by
the cardiologist is attached.
CLINICAL DATA: Risk stratification
Coronary Calcium Score
TECHNIQUE: The patient was scanned on a Siemens Force scanner. Axial
non-contrast 3 mm slices were carried out through the heart. The
data set was analyzed on a dedicated work station and scored using
the Agatson method.

[Series 2: casc 3.0 bv41 2 bestdiast 71 % · axial · 0.42mm/px · z∈[-213,-123]mm · 4 of 51 slices shown, 5 images]
[im 11/51  vessel]
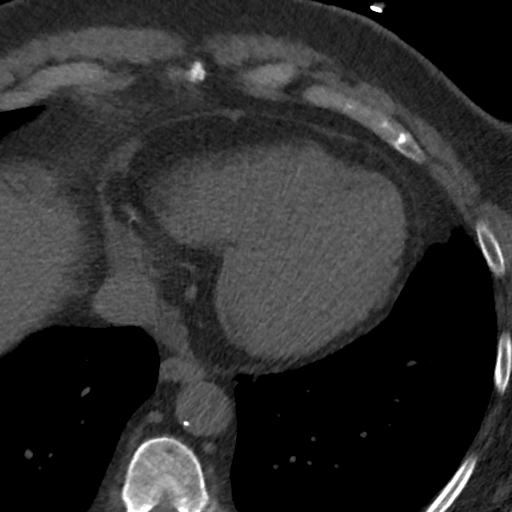
[im 11/51  lung]
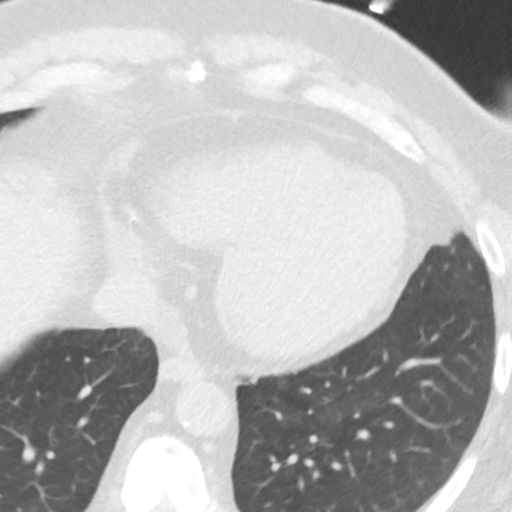
[im 21/51  vessel]
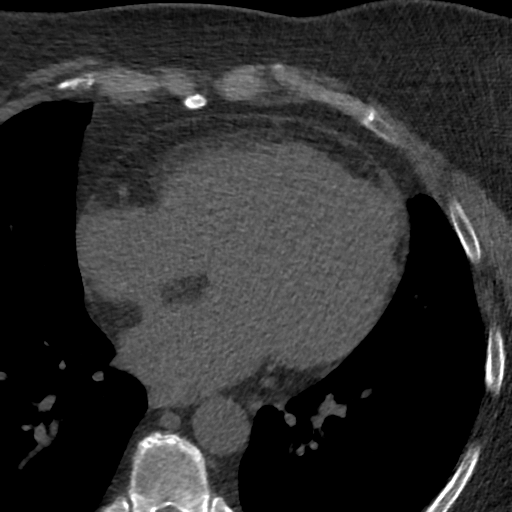
[im 31/51  vessel]
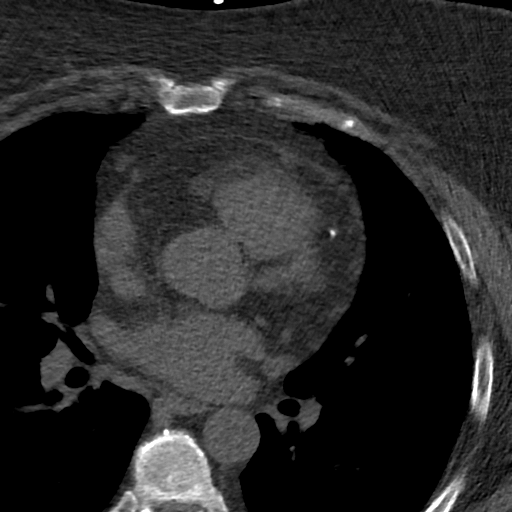
[im 41/51  vessel]
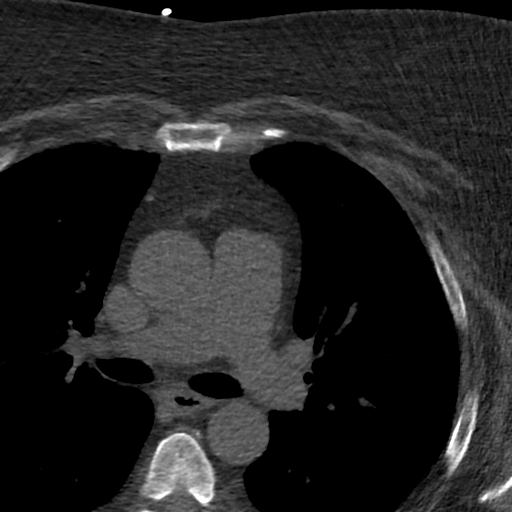

[Series 3: lung 72 % · axial · 0.71mm/px · z∈[-222,-120]mm · 5 of 52 slices shown]
[im 9/52  lung]
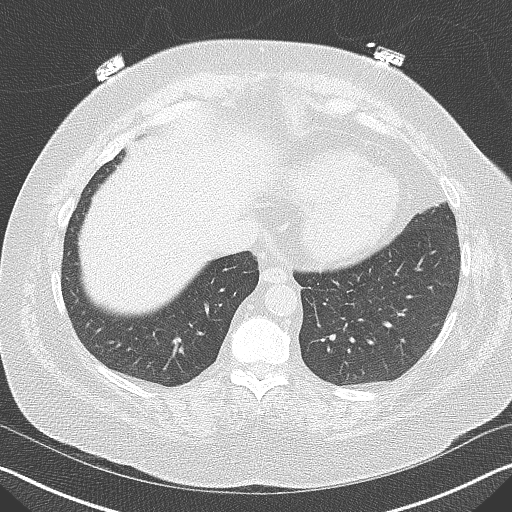
[im 18/52  lung]
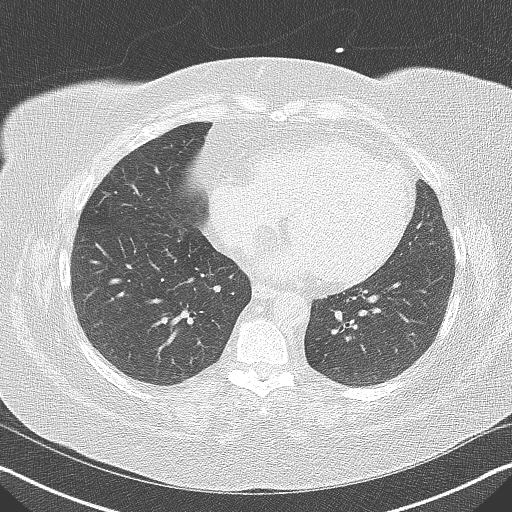
[im 26/52  lung]
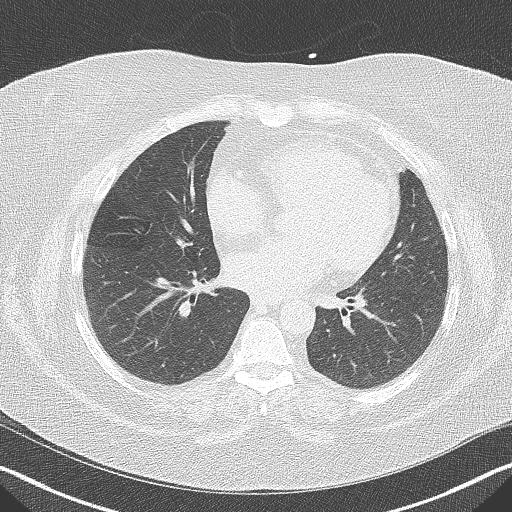
[im 35/52  lung]
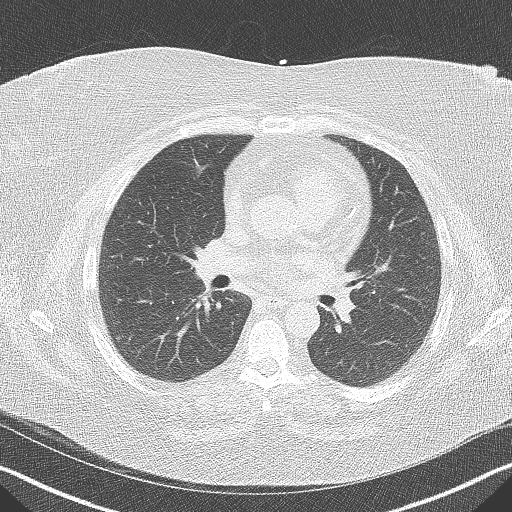
[im 43/52  lung]
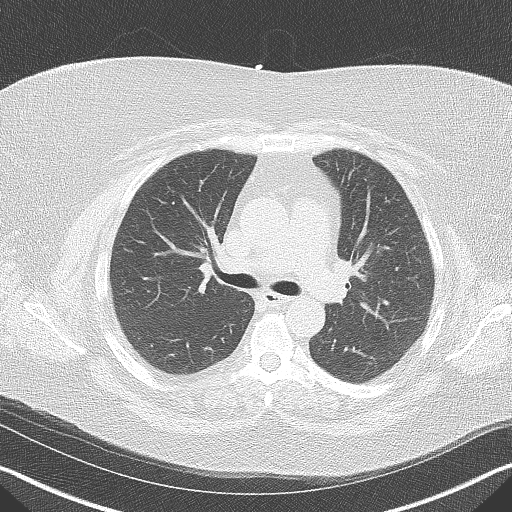

[Series 4: lung st 72 % · axial · 0.71mm/px · z∈[-222,-120]mm · 5 of 52 slices shown]
[im 9/52  lung]
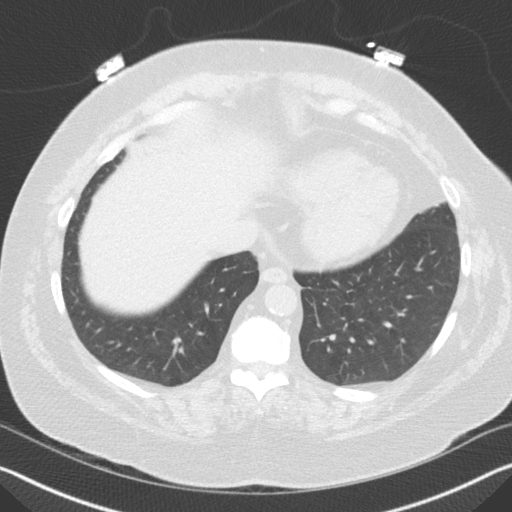
[im 18/52  lung]
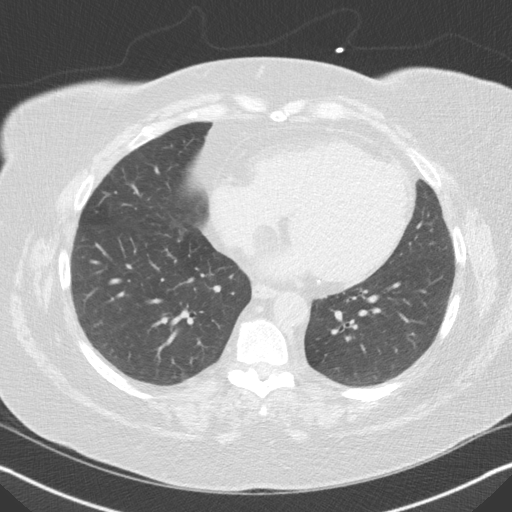
[im 26/52  lung]
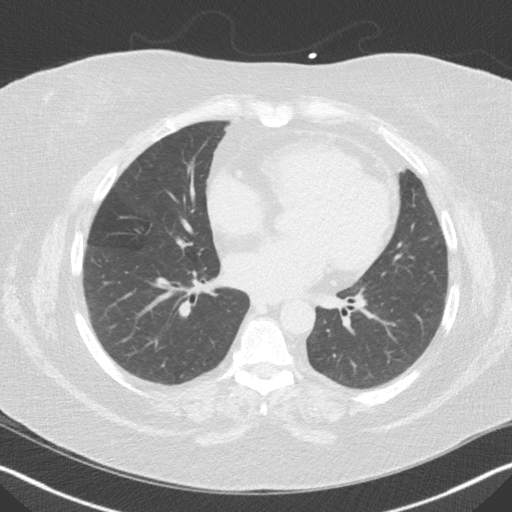
[im 35/52  lung]
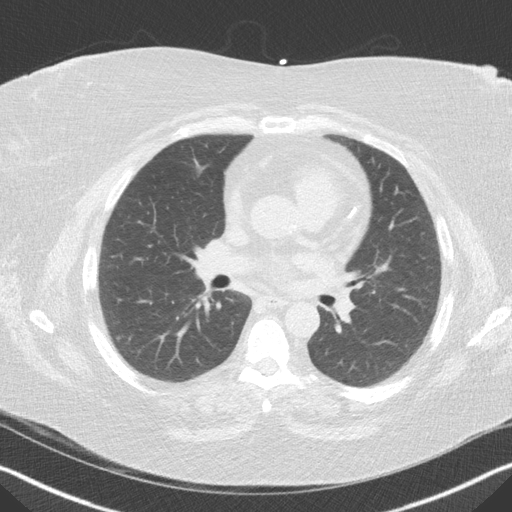
[im 43/52  lung]
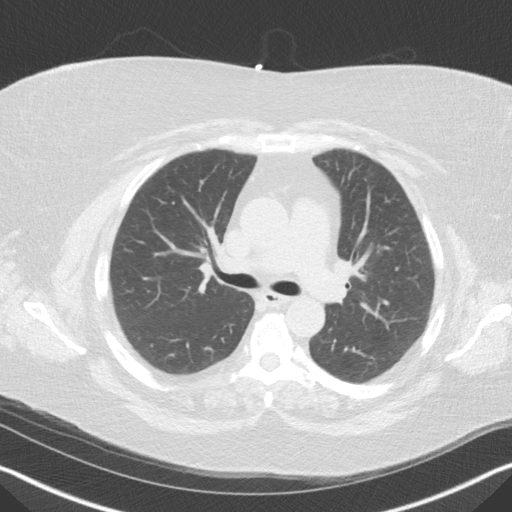

[14 of 20 positions shown; findings below may reference images not displayed]

FINDINGS: Aortic atherosclerosis. Within the visualized portions of the thorax
there are no suspicious appearing pulmonary nodules or masses, there
is no acute consolidative airspace disease, no pleural effusions, no
pneumothorax and no lymphadenopathy. Visualized portions of the
upper abdomen are unremarkable. There are no aggressive appearing
lytic or blastic lesions noted in the visualized portions of the
skeleton.
IMPRESSION: 1.  Aortic Atherosclerosis (24G2A-AQD.D).
FINDINGS: Non-cardiac: See separate report from [REDACTED].

Ascending Aorta: Normal size, trivial calcifications.

Pericardium: Normal.

Coronary arteries: Normal origin.
IMPRESSION: Coronary calcium score of 829. This was 99 percentile for age and
sex matched control.

*** End of Addendum ***
EXAM:
OVER-READ INTERPRETATION  CT CHEST

The following report is an over-read performed by radiologist Dr.
Kaki Jim [REDACTED] on 05/11/2020. This
over-read does not include interpretation of cardiac or coronary
anatomy or pathology. The coronary calcium score interpretation by
the cardiologist is attached.
FINDINGS: Aortic atherosclerosis. Within the visualized portions of the thorax
there are no suspicious appearing pulmonary nodules or masses, there
is no acute consolidative airspace disease, no pleural effusions, no
pneumothorax and no lymphadenopathy. Visualized portions of the
upper abdomen are unremarkable. There are no aggressive appearing
lytic or blastic lesions noted in the visualized portions of the
skeleton.
IMPRESSION: 1.  Aortic Atherosclerosis (24G2A-AQD.D).

## 2022-08-12 ENCOUNTER — Telehealth: Payer: Self-pay

## 2022-08-12 DIAGNOSIS — E1165 Type 2 diabetes mellitus with hyperglycemia: Secondary | ICD-10-CM

## 2022-08-12 DIAGNOSIS — I10 Essential (primary) hypertension: Secondary | ICD-10-CM

## 2022-08-12 DIAGNOSIS — F339 Major depressive disorder, recurrent, unspecified: Secondary | ICD-10-CM

## 2022-08-12 MED ORDER — AMLODIPINE BESYLATE 10 MG PO TABS: 10.0000 mg | ORAL_TABLET | Freq: Every day | ORAL | 0 refills | Status: DC

## 2022-08-12 MED ORDER — VENLAFAXINE HCL ER 150 MG PO CP24: 150.0000 mg | ORAL_CAPSULE | Freq: Every day | ORAL | 0 refills | Status: DC

## 2022-08-12 MED ORDER — PIOGLITAZONE HCL 30 MG PO TABS: 30.0000 mg | ORAL_TABLET | Freq: Every day | ORAL | 0 refills | Status: DC

## 2022-08-12 NOTE — Telephone Encounter (Signed)
EFFEXOR,ACTOS,NORVACS med refills.

## 2022-08-12 NOTE — Telephone Encounter (Signed)
Medication sent to pharmacy  

## 2022-08-12 NOTE — Telephone Encounter (Signed)
Patient called about her needing a appointment for medication refill, patient is scheduled for 12/26 for med refill, patient wanted to see if she could get medication refills until her scheduled appointment, please advise, thanks@

## 2022-08-26 ENCOUNTER — Encounter: Payer: Self-pay | Admitting: Family Medicine

## 2022-08-26 ENCOUNTER — Ambulatory Visit (INDEPENDENT_AMBULATORY_CARE_PROVIDER_SITE_OTHER): Payer: No Typology Code available for payment source | Admitting: Family Medicine

## 2022-08-26 VITALS — BP 138/72 | Ht 62.0 in | Wt 211.0 lb

## 2022-08-26 DIAGNOSIS — F339 Major depressive disorder, recurrent, unspecified: Secondary | ICD-10-CM

## 2022-08-26 DIAGNOSIS — E1165 Type 2 diabetes mellitus with hyperglycemia: Secondary | ICD-10-CM | POA: Diagnosis not present

## 2022-08-26 DIAGNOSIS — Z23 Encounter for immunization: Secondary | ICD-10-CM

## 2022-08-26 DIAGNOSIS — E782 Mixed hyperlipidemia: Secondary | ICD-10-CM

## 2022-08-26 DIAGNOSIS — Z1211 Encounter for screening for malignant neoplasm of colon: Secondary | ICD-10-CM

## 2022-08-26 DIAGNOSIS — K219 Gastro-esophageal reflux disease without esophagitis: Secondary | ICD-10-CM

## 2022-08-26 DIAGNOSIS — I10 Essential (primary) hypertension: Secondary | ICD-10-CM

## 2022-08-26 DIAGNOSIS — Z789 Other specified health status: Secondary | ICD-10-CM

## 2022-08-26 DIAGNOSIS — D518 Other vitamin B12 deficiency anemias: Secondary | ICD-10-CM

## 2022-08-26 LAB — POCT GLYCOSYLATED HEMOGLOBIN (HGB A1C): Hemoglobin A1C: 6.8 % — AB (ref 4.0–5.6)

## 2022-08-26 MED ORDER — ONDANSETRON HCL 8 MG PO TABS: 8.0000 mg | ORAL_TABLET | Freq: Three times a day (TID) | ORAL | 1 refills | Status: DC | PRN

## 2022-08-26 MED ORDER — PIOGLITAZONE HCL 30 MG PO TABS: 30.0000 mg | ORAL_TABLET | Freq: Every day | ORAL | 3 refills | Status: DC

## 2022-08-26 MED ORDER — VENLAFAXINE HCL ER 150 MG PO CP24: 150.0000 mg | ORAL_CAPSULE | Freq: Every day | ORAL | 3 refills | Status: DC

## 2022-08-26 MED ORDER — OMEPRAZOLE 40 MG PO CPDR: 40.0000 mg | DELAYED_RELEASE_CAPSULE | Freq: Every day | ORAL | 3 refills | Status: DC

## 2022-08-26 NOTE — Progress Notes (Signed)
Established Patient Office Visit  Subjective   Patient ID: Samantha [name], female    DOB: 1966/07/07  Age: 56 y.o. MRN: [id number]  Chief Complaint  Patient presents with   Diabetes    HPI  Hypertension- Pt denies chest pain, SOB, dizziness, or heart palpitations.  Taking meds as directed w/o problems.  Denies medication side effects.    Diabetes - no hypoglycemic events. No wounds or sores that are not healing well. No increased thirst or urination. Checking glucose at home. Taking medications as prescribed without any side effects.  Follow-up depression- she is doing OK. Working a lot of hours each week. Does well at work but feels more stressed when she is home. Says normally keeps her house very neat but just feels overwhelmed.       ROS    Objective:     BP 138/72   Ht _0  (1.575 m)   Wt 211 lb (95.7 kg)   LMP  (LMP Unknown)   SpO2 97%   BMI 38.59 kg/m    Physical Exam Vitals and nursing note reviewed.  Constitutional:      Appearance: She is well-developed.  HENT:     Head: Normocephalic and atraumatic.  Cardiovascular:     Rate and Rhythm: Normal rate and regular rhythm.     Heart sounds: Normal heart sounds.  Pulmonary:     Effort: Pulmonary effort is normal.     Breath sounds: Normal breath sounds.  Skin:    General: Skin is warm and dry.  Neurological:     Mental Status: She is alert and oriented to person, place, and time.  Psychiatric:        Behavior: Behavior normal.      Results for orders placed or performed in visit on 08/26/22  Lipid Panel w/reflex Direct LDL  Result Value Ref Range   Cholesterol 209 (H) <200 mg/dL   HDL 42 (L) > OR = 50 mg/dL   Triglycerides 152 (H) <150 mg/dL   LDL Cholesterol (Calc) 139 (H) mg/dL (calc)   Total CHOL/HDL Ratio 5.0 (H) <5.0 (calc)   Non-HDL Cholesterol (Calc) 167 (H) <130 mg/dL (calc)  COMPLETE METABOLIC PANEL WITH GFR  Result Value Ref Range   Glucose, Bld 124 (H) 65 - 99 mg/dL   BUN 10 7  - 25 mg/dL   Creat 0.63 0.50 - 1.03 mg/dL   eGFR 105 > OR = 60 mL/min/1.27m   BUN/Creatinine Ratio SEE NOTE: 6 - 22 (calc)   Sodium 140 135 - 146 mmol/L   Potassium 4.3 3.5 - 5.3 mmol/L   Chloride 104 98 - 110 mmol/L   CO2 27 20 - 32 mmol/L   Calcium 9.4 8.6 - 10.4 mg/dL   Total Protein 7.2 6.1 - 8.1 g/dL   Albumin 4.4 3.6 - 5.1 g/dL   Globulin 2.8 1.9 - 3.7 g/dL (calc)   AG Ratio 1.6 1.0 - 2.5 (calc)   Total Bilirubin 0.4 0.2 - 1.2 mg/dL   Alkaline phosphatase (APISO) 89 37 - 153 U/L   AST 32 10 - 35 U/L   ALT 46 (H) 6 - 29 U/L  CBC  Result Value Ref Range   WBC 6.4 3.8 - 10.8 Thousand/uL   RBC 4.79 3.80 - 5.10 Million/uL   Hemoglobin 15.0 11.7 - 15.5 g/dL   HCT 42.8 35.0 - 45.0 %   MCV 89.4 80.0 - 100.0 fL   MCH 31.3 27.0 - 33.0 pg   MCHC 35.0  32.0 - 36.0 g/dL   RDW 12.3 11.0 - 15.0 %   Platelets 148 140 - 400 Thousand/uL   MPV 11.7 7.5 - 12.5 fL  B12  Result Value Ref Range   Vitamin B-12 350 200 - 1,100 pg/mL  POCT glycosylated hemoglobin (Hb A1C)  Result Value Ref Range   Hemoglobin A1C 6.8 (A) 4.0 - 5.6 %   HbA1c POC (<> result, manual entry)     HbA1c, POC (prediabetic range)     HbA1c, POC (controlled diabetic range)        The ASCVD Risk score (Arnett DK, et al., 2019) failed to calculate for the following reasons:   The patient has a prior MI or stroke diagnosis    Assessment & Plan:   Problem List Items Addressed This Visit       Cardiovascular and Mediastinum   Essential hypertension    Well controlled. Continue current regimen. Follow up in  4 mo due for labs today.        Relevant Orders   Lipid Panel w/reflex Direct LDL (Completed)   COMPLETE METABOLIC PANEL WITH GFR (Completed)   CBC (Completed)     Digestive   GERD (gastroesophageal reflux disease)   Relevant Medications   omeprazole (PRILOSEC) 40 MG capsule   ondansetron (ZOFRAN) 8 MG tablet     Endocrine   Type II diabetes mellitus (HCC)    A1c looks phenomenal today at 6.8.   She is really doing great with her diet and with the Actos.  She occasionally gets some swelling in her left ankle but otherwise doing well.  He is not currently on a statin but is okay with getting her pneumonia vaccine updated today so we will do that.  Follow-up in 3 to 4 months.      Relevant Medications   pioglitazone (ACTOS) 30 MG tablet   Other Relevant Orders   POCT glycosylated hemoglobin (Hb A1C) (Completed)   Lipid Panel w/reflex Direct LDL (Completed)   COMPLETE METABOLIC PANEL WITH GFR (Completed)   CBC (Completed)     Other   Depression, recurrent (HCC) - Primary    PHQ-9 score of 6 and GAD-7 score of 11 right now she is struggling a little bit more with her anxiety we tried a couple things in the past.  But I did discuss possible therapy/counseling if that something she is interested in please let me know.      Relevant Medications   venlafaxine XR (EFFEXOR-XR) 150 MG 24 hr capsule   B12 deficiency anemia    To recheck B12 levels today.      Relevant Orders   B12 (Completed)   Other Visit Diagnoses     Need for pneumococcal 20-valent conjugate vaccination       Relevant Orders   Pneumococcal conjugate vaccine 20-valent (Prevnar 20) (Completed)   Screening for malignant neoplasm of colon       Relevant Orders   Ambulatory referral to Gastroenterology       Return in about 4 months (around 12/26/2022) for Diabetes follow-up.    Beatrice Lecher, MD

## 2022-08-26 NOTE — Assessment & Plan Note (Signed)
PHQ-9 score of 6 and GAD-7 score of 11 right now she is struggling a little bit more with her anxiety we tried a couple things in the past.  But I did discuss possible therapy/counseling if that something she is interested in please let me know.

## 2022-08-26 NOTE — Assessment & Plan Note (Signed)
Well controlled. Continue current regimen. Follow up in  4 mo due for labs today.

## 2022-08-26 NOTE — Assessment & Plan Note (Signed)
To recheck B12 levels today.

## 2022-08-26 NOTE — Assessment & Plan Note (Signed)
A1c looks phenomenal today at 6.8.  She is really doing great with her diet and with the Actos.  She occasionally gets some swelling in her left ankle but otherwise doing well.  He is not currently on a statin but is okay with getting her pneumonia vaccine updated today so we will do that.  Follow-up in 3 to 4 months.

## 2022-08-27 ENCOUNTER — Other Ambulatory Visit: Payer: Self-pay | Admitting: Family Medicine

## 2022-08-27 DIAGNOSIS — I10 Essential (primary) hypertension: Secondary | ICD-10-CM

## 2022-08-27 LAB — COMPLETE METABOLIC PANEL WITH GFR
AG Ratio: 1.6 (calc) (ref 1.0–2.5)
ALT: 46 U/L — ABNORMAL HIGH (ref 6–29)
AST: 32 U/L (ref 10–35)
Albumin: 4.4 g/dL (ref 3.6–5.1)
Alkaline phosphatase (APISO): 89 U/L (ref 37–153)
BUN: 10 mg/dL (ref 7–25)
CO2: 27 mmol/L (ref 20–32)
Calcium: 9.4 mg/dL (ref 8.6–10.4)
Chloride: 104 mmol/L (ref 98–110)
Creat: 0.63 mg/dL (ref 0.50–1.03)
Globulin: 2.8 g/dL (calc) (ref 1.9–3.7)
Glucose, Bld: 124 mg/dL — ABNORMAL HIGH (ref 65–99)
Potassium: 4.3 mmol/L (ref 3.5–5.3)
Sodium: 140 mmol/L (ref 135–146)
Total Bilirubin: 0.4 mg/dL (ref 0.2–1.2)
Total Protein: 7.2 g/dL (ref 6.1–8.1)
eGFR: 105 mL/min/{1.73_m2} (ref >=60)

## 2022-08-27 LAB — CBC
HCT: 42.8 % (ref 35.0–45.0)
Hemoglobin: 15 g/dL (ref 11.7–15.5)
MCH: 31.3 pg (ref 27.0–33.0)
MCHC: 35 g/dL (ref 32.0–36.0)
MCV: 89.4 fL (ref 80.0–100.0)
MPV: 11.7 fL (ref 7.5–12.5)
Platelets: 148 10*3/uL (ref 140–400)
RBC: 4.79 10*6/uL (ref 3.80–5.10)
RDW: 12.3 % (ref 11.0–15.0)
WBC: 6.4 10*3/uL (ref 3.8–10.8)

## 2022-08-27 LAB — LIPID PANEL W/REFLEX DIRECT LDL
Cholesterol: 209 mg/dL — ABNORMAL HIGH (ref <200)
HDL: 42 mg/dL — ABNORMAL LOW (ref >=50)
LDL Cholesterol (Calc): 139 mg/dL (calc) — ABNORMAL HIGH
Non-HDL Cholesterol (Calc): 167 mg/dL (calc) — ABNORMAL HIGH (ref <130)
Total CHOL/HDL Ratio: 5 (calc) — ABNORMAL HIGH (ref <5.0)
Triglycerides: 152 mg/dL — ABNORMAL HIGH (ref <150)

## 2022-08-27 LAB — VITAMIN B12: Vitamin B-12: 350 pg/mL (ref 200–1100)

## 2022-08-27 NOTE — Progress Notes (Signed)
Hi Samantha [name], cholesterol is up compared to a couple years ago.  Based on current guidelines for diabetes it is highly recommended that you take a statin at bedtime to reduce your risk for heart disease and stroke this will help improve your numbers in addition to reducing your risk that comes from just having diabetes.  If you are okay with this please let me know and I will send over prescription to your pharmacy.  ALT liver enzyme is still just slightly elevated but better than last time.  Your blood count looks great.  Vitamin B12 is normal.

## 2022-08-27 NOTE — Progress Notes (Signed)
There is an injectable medication every 2 weeks called repatha that is not a statin. We could see if her insurance will cover it.

## 2022-09-03 ENCOUNTER — Other Ambulatory Visit: Payer: Self-pay | Admitting: *Deleted

## 2022-09-03 MED ORDER — REPATHA 140 MG/ML ~~LOC~~ SOSY: 140.0000 mg | PREFILLED_SYRINGE | SUBCUTANEOUS | 4 refills | Status: DC

## 2022-09-03 NOTE — Addendum Note (Signed)
Addended by: Beatrice Lecher D on: 09/03/2022 02:45 PM   Modules accepted: Orders

## 2022-09-03 NOTE — Progress Notes (Signed)
  Sent to [facility]. Not sure if that is correct pharmacy.   Orders Placed This Encounter     omeprazole (PRILOSEC) 40 MG capsule         Sig: Take 1 capsule (40 mg total) by mouth daily.         Dispense:  90 capsule         Refill:  3     ondansetron (ZOFRAN) 8 MG tablet         Sig: Take 1 tablet (8 mg total) by mouth every 8 (eight) hours as needed for nausea or vomiting.         Dispense:  30 tablet         Refill:  1     pioglitazone (ACTOS) 30 MG tablet         Sig: Take 1 tablet (30 mg total) by mouth daily.         Dispense:  90 tablet         Refill:  3     venlafaxine XR (EFFEXOR-XR) 150 MG 24 hr capsule         Sig: Take 1 capsule (150 mg total) by mouth daily with breakfast.         Dispense:  90 capsule         Refill:  3     Evolocumab (REPATHA) 140 MG/ML SOSY         Sig: Inject 140 mg into the skin every 14 (fourteen) days.         Dispense:  6.3 mL         Refill:  4         Order Comments: Intolerant to statins

## 2022-10-23 ENCOUNTER — Other Ambulatory Visit: Payer: Self-pay | Admitting: Family Medicine

## 2022-10-23 DIAGNOSIS — I10 Essential (primary) hypertension: Secondary | ICD-10-CM

## 2022-11-02 ENCOUNTER — Ambulatory Visit (INDEPENDENT_AMBULATORY_CARE_PROVIDER_SITE_OTHER): Payer: No Typology Code available for payment source

## 2022-11-02 ENCOUNTER — Ambulatory Visit
Admission: EM | Admit: 2022-11-02 | Discharge: 2022-11-02 | Disposition: A | Discharge status: Home / Self Care | Payer: No Typology Code available for payment source | Attending: Urgent Care | Admitting: Urgent Care

## 2022-11-02 DIAGNOSIS — R051 Acute cough: Secondary | ICD-10-CM | POA: Insufficient documentation

## 2022-11-02 DIAGNOSIS — F17200 Nicotine dependence, unspecified, uncomplicated: Secondary | ICD-10-CM

## 2022-11-02 DIAGNOSIS — F17210 Nicotine dependence, cigarettes, uncomplicated: Secondary | ICD-10-CM | POA: Diagnosis not present

## 2022-11-02 DIAGNOSIS — E119 Type 2 diabetes mellitus without complications: Secondary | ICD-10-CM | POA: Insufficient documentation

## 2022-11-02 DIAGNOSIS — J40 Bronchitis, not specified as acute or chronic: Secondary | ICD-10-CM

## 2022-11-02 DIAGNOSIS — Z1152 Encounter for screening for COVID-19: Secondary | ICD-10-CM | POA: Insufficient documentation

## 2022-11-02 DIAGNOSIS — J209 Acute bronchitis, unspecified: Secondary | ICD-10-CM | POA: Diagnosis not present

## 2022-11-02 MED ORDER — PROMETHAZINE-DM 6.25-15 MG/5ML PO SYRP: 5.0000 mL | ORAL_SOLUTION | Freq: Three times a day (TID) | ORAL | 0 refills | Status: DC | PRN

## 2022-11-02 MED ORDER — ALBUTEROL SULFATE HFA 108 (90 BASE) MCG/ACT IN AERS: 1.0000 | INHALATION_SPRAY | Freq: Four times a day (QID) | RESPIRATORY_TRACT | 0 refills | Status: DC | PRN

## 2022-11-02 MED ORDER — PREDNISONE 20 MG PO TABS: ORAL_TABLET | ORAL | 0 refills | Status: DC

## 2022-11-02 NOTE — Discharge Instructions (Addendum)
We will notify you of your test results as they arrive and may take between about 24 hours.  I encourage you to sign up for MyChart if you have not already done so as this can be the easiest way for [place] to communicate results to you online or through a phone app.  Generally, we only contact you if it is a positive test result.  In the meantime, if you develop worsening symptoms including fever, chest pain, shortness of breath despite our current treatment plan then please report to the emergency room as this may be a sign of worsening status from possible viral infection.  Otherwise, we will manage this as a viral syndrome. For sore throat or cough try using a honey-based tea. Use 3 teaspoons of honey with juice squeezed from half lemon. Place shaved pieces of ginger into 1/2-1 cup of water and warm over stove top. Then mix the ingredients and repeat every 4 hours as needed. Please take Tylenol '500mg'$ -'650mg'$  every 6 hours for aches and pains, fevers. Hydrate very well with at least 2 liters of water. Eat light meals such as soups to replenish electrolytes and soft fruits, veggies. Start an antihistamine like Zyrtec ('10mg'$  daily) for postnasal drainage, sinus congestion.  You can take this together with prednisone and albuterol.  Use the cough medications as needed.   If he test positive for COVID-19, we will look to prescribe the COVID antiviral medication.

## 2022-11-02 NOTE — ED Triage Notes (Signed)
Pt c/o dry cough, head/chest congestion, fever, body aches x 2 days-states she had neg covid home test-NAD-steady gait

## 2022-11-02 NOTE — ED Provider Notes (Signed)
[facility]  Note:  This document was prepared using Systems analyst and may include unintentional dictation errors.  MRN: [id number] DOB: [birth year]  Subjective:   Samantha [name] is a 57 y.o. female presenting for 2-day history of acute onset dry cough, sinus congestion, chest congestion, body pains, fever. Has a history of bronchitis. Smokes 1ppd.  Has type 2 diabetes treated without insulin.   Current Facility-Administered Medications:    0.9 %  sodium chloride infusion, 500 mL, Intravenous, Once, Armbruster, Carlota Raspberry, MD  Current Outpatient Medications:    amLODipine (NORVASC) 10 MG tablet, TAKE ONE TABLET BY MOUTH DAILY, Disp: 90 tablet, Rfl: 1   Evolocumab (REPATHA) 140 MG/ML SOSY, Inject 140 mg into the skin every 14 (fourteen) days., Disp: 6.3 mL, Rfl: 4   metoCLOPramide (REGLAN) 5 MG tablet, Take 1 tablet (5 mg total) by mouth 3 (three) times daily before meals., Disp: 90 tablet, Rfl: 1   omeprazole (PRILOSEC) 40 MG capsule, Take 1 capsule (40 mg total) by mouth daily., Disp: 90 capsule, Rfl: 3   ondansetron (ZOFRAN) 8 MG tablet, Take 1 tablet (8 mg total) by mouth every 8 (eight) hours as needed for nausea or vomiting., Disp: 30 tablet, Rfl: 1   oxyCODONE-acetaminophen (PERCOCET) 10-325 MG tablet, Take 1 tablet by mouth 4 (four) times daily as needed., Disp: , Rfl:    pioglitazone (ACTOS) 30 MG tablet, Take 1 tablet (30 mg total) by mouth daily., Disp: 90 tablet, Rfl: 3   venlafaxine XR (EFFEXOR-XR) 150 MG 24 hr capsule, Take 1 capsule (150 mg total) by mouth daily with breakfast., Disp: 90 capsule, Rfl: 3   Vitamin D, Ergocalciferol, (DRISDOL) 1.25 MG (50000 UNIT) CAPS capsule, Take 1 capsule (50,000 Units total) by mouth once a week., Disp: 12 capsule, Rfl: 1   Allergies  Allergen Reactions   Ace Inhibitors Swelling and Other (See Comments)   Atorvastatin     myalgia   Metformin And Related Other (See Comments)    Diarrhea    Beta Adrenergic Blockers Other (See Comments)    Low heart rate   Glipizide Nausea Only    Past Medical History:  Diagnosis Date   Anxiety    Chronic bronchitis (HCC)    Depression    Diabetes mellitus without complication (HCC)    GERD (gastroesophageal reflux disease)    Hypertension    Mitral valve prolapse    with regurgitation   Seizures (HCC)    Stroke [facility])      Past Surgical History:  Procedure Laterality Date   APPENDECTOMY     CHOLECYSTECTOMY     TOTAL ABDOMINAL HYSTERECTOMY  1990    Family History  Problem Relation Age of Onset   Hypertension Father    Heart attack Father    Stroke Father    Skin cancer Father    Liver disease Father    Breast cancer Paternal Grandmother    Diabetes Paternal Grandmother    Heart attack Paternal Grandfather    Colon cancer Neg Hx    Esophageal cancer Neg Hx     Social History   Tobacco Use   Smoking status: Every Day    Packs/day: 1.00    Years: 25.00    Total pack years: 25.00    Types: Cigarettes   Smokeless tobacco: Never   Tobacco comments:    Less than a pack a day   Vaping Use   Vaping Use: Never used  Substance Use Topics  Alcohol use: Not Currently   Drug use: Never    ROS   Objective:   Vitals: BP (!) 160/93 (BP Location: Right Arm)   Pulse 74   Temp 98.1 F (36.7 C) (Oral)   Resp 20   LMP  (LMP Unknown)   SpO2 96%   Physical Exam Constitutional:      General: She is not in acute distress.    Appearance: Normal appearance. She is well-developed. She is obese. She is not ill-appearing, toxic-appearing or diaphoretic.  HENT:     Head: Normocephalic and atraumatic.     Nose: Nose normal.     Mouth/Throat:     Mouth: Mucous membranes are moist.  Eyes:     General: No scleral icterus.       Right eye: No discharge.        Left eye: No discharge.     Extraocular Movements: Extraocular movements intact.  Cardiovascular:     Rate and Rhythm: Normal rate and regular rhythm.     Heart  sounds: Normal heart sounds. No murmur heard.    No friction rub. No gallop.  Pulmonary:     Effort: Pulmonary effort is normal. No respiratory distress.     Breath sounds: No stridor. Examination of the right-middle field reveals wheezing and rhonchi. Examination of the left-middle field reveals wheezing and rhonchi. Wheezing and rhonchi present. No rales.  Chest:     Chest wall: No tenderness.  Skin:    General: Skin is warm and dry.  Neurological:     General: No focal deficit present.     Mental Status: She is alert and oriented to person, place, and time.  Psychiatric:        Mood and Affect: Mood normal.        Behavior: Behavior normal.    DG Chest 2 View  Result Date: 11/02/2022 CLINICAL DATA:  Chest congestion, bronchitis.  Dry cough EXAM: CHEST - 2 VIEW COMPARISON:  None Available. FINDINGS: The heart size and mediastinal contours are within normal limits. Both lungs are clear. The visualized skeletal structures are unremarkable. IMPRESSION: No active cardiopulmonary disease. Electronically Signed   By: Keane Police D.O.   On: 11/02/2022 13:44    Assessment and Plan :   PDMP not reviewed this encounter.  1. Acute bronchitis, unspecified organism   2. Type 2 diabetes mellitus treated without insulin ([place])   3. Acute cough   4. Heavy smoker   5. Morbid obesity ([place])     Given pulmonary exam, history of bronchitis and her smoking recommended oral prednisone course.  Also advised albuterol, cough medication.  Will manage for viral illness such as viral URI, viral syndrome, viral rhinitis, COVID-19. Recommended supportive care. Offered scripts for symptomatic relief. Testing is pending. Counseled patient on potential for adverse effects with medications prescribed/recommended today, ER and return-to-clinic precautions discussed, patient verbalized understanding.   Given risk factors of being a heavy smoker, morbid obesity, type 2 diabetes, she is a candidate for COVID antiviral  treatment.  If she test positive, should undergo this treatment.   Jaynee Eagles, [place] 11/02/22 [phone]

## 2022-11-03 ENCOUNTER — Telehealth (HOSPITAL_COMMUNITY): Payer: Self-pay | Admitting: Emergency Medicine

## 2022-11-03 LAB — SARS CORONAVIRUS 2 (TAT 6-24 HRS): SARS Coronavirus 2: POSITIVE — AB

## 2022-11-03 MED ORDER — NIRMATRELVIR/RITONAVIR (PAXLOVID)TABLET: 3.0000 | ORAL_TABLET | Freq: Two times a day (BID) | ORAL | 0 refills | Status: DC

## 2022-11-03 MED ORDER — MOLNUPIRAVIR EUA 200MG CAPSULE: 4.0000 | ORAL_CAPSULE | Freq: Two times a day (BID) | ORAL | 0 refills | Status: AC

## 2022-11-03 NOTE — Telephone Encounter (Signed)
Cancelled Paxlovid prescription, due to patient's oxycodone daily that she is unable to stop for the duration of treatment Per DR. Banister, will send Molnupiravir. Reviewed with patient, verified pharmacy, prescription sent

## 2022-12-11 ENCOUNTER — Encounter: Payer: Self-pay | Admitting: Family Medicine

## 2022-12-11 DIAGNOSIS — F339 Major depressive disorder, recurrent, unspecified: Secondary | ICD-10-CM

## 2022-12-12 MED ORDER — VENLAFAXINE HCL ER 150 MG PO CP24: 150.0000 mg | ORAL_CAPSULE | Freq: Every day | ORAL | 0 refills | Status: DC

## 2022-12-18 ENCOUNTER — Ambulatory Visit: Payer: No Typology Code available for payment source | Admitting: Family Medicine

## 2022-12-19 ENCOUNTER — Ambulatory Visit: Payer: No Typology Code available for payment source | Admitting: Family Medicine

## 2022-12-19 DIAGNOSIS — G720 Drug-induced myopathy: Secondary | ICD-10-CM | POA: Insufficient documentation

## 2023-03-17 ENCOUNTER — Other Ambulatory Visit: Payer: Self-pay | Admitting: Family Medicine

## 2023-03-17 DIAGNOSIS — E559 Vitamin D deficiency, unspecified: Secondary | ICD-10-CM

## 2023-06-20 ENCOUNTER — Other Ambulatory Visit: Payer: Self-pay | Admitting: Family Medicine

## 2023-06-20 DIAGNOSIS — I10 Essential (primary) hypertension: Secondary | ICD-10-CM

## 2023-09-08 ENCOUNTER — Emergency Department (HOSPITAL_BASED_OUTPATIENT_CLINIC_OR_DEPARTMENT_OTHER)
Admission: EM | Admit: 2023-09-08 | Discharge: 2023-09-08 | Disposition: A | Discharge status: Home / Self Care | Payer: PRIVATE HEALTH INSURANCE | Attending: Emergency Medicine | Admitting: Emergency Medicine

## 2023-09-08 ENCOUNTER — Ambulatory Visit: Payer: No Typology Code available for payment source | Admitting: Family Medicine

## 2023-09-08 ENCOUNTER — Other Ambulatory Visit: Payer: Self-pay

## 2023-09-08 ENCOUNTER — Other Ambulatory Visit (HOSPITAL_BASED_OUTPATIENT_CLINIC_OR_DEPARTMENT_OTHER): Payer: Self-pay

## 2023-09-08 ENCOUNTER — Telehealth (HOSPITAL_COMMUNITY): Payer: Self-pay | Admitting: Pharmacy Technician

## 2023-09-08 ENCOUNTER — Encounter (HOSPITAL_BASED_OUTPATIENT_CLINIC_OR_DEPARTMENT_OTHER): Payer: Self-pay

## 2023-09-08 ENCOUNTER — Ambulatory Visit: Payer: Self-pay | Admitting: Family Medicine

## 2023-09-08 ENCOUNTER — Emergency Department (HOSPITAL_BASED_OUTPATIENT_CLINIC_OR_DEPARTMENT_OTHER): Payer: PRIVATE HEALTH INSURANCE

## 2023-09-08 ENCOUNTER — Other Ambulatory Visit (HOSPITAL_COMMUNITY): Payer: Self-pay

## 2023-09-08 DIAGNOSIS — R079 Chest pain, unspecified: Secondary | ICD-10-CM | POA: Diagnosis not present

## 2023-09-08 DIAGNOSIS — R739 Hyperglycemia, unspecified: Secondary | ICD-10-CM

## 2023-09-08 DIAGNOSIS — R42 Dizziness and giddiness: Secondary | ICD-10-CM | POA: Diagnosis present

## 2023-09-08 DIAGNOSIS — Z79899 Other long term (current) drug therapy: Secondary | ICD-10-CM | POA: Diagnosis not present

## 2023-09-08 DIAGNOSIS — E1165 Type 2 diabetes mellitus with hyperglycemia: Secondary | ICD-10-CM | POA: Insufficient documentation

## 2023-09-08 DIAGNOSIS — Z7984 Long term (current) use of oral hypoglycemic drugs: Secondary | ICD-10-CM | POA: Insufficient documentation

## 2023-09-08 DIAGNOSIS — I10 Essential (primary) hypertension: Secondary | ICD-10-CM | POA: Diagnosis not present

## 2023-09-08 DIAGNOSIS — A599 Trichomoniasis, unspecified: Secondary | ICD-10-CM

## 2023-09-08 LAB — CBC
HCT: 41.8 % (ref 36.0–46.0)
Hemoglobin: 14.4 g/dL (ref 12.0–15.0)
MCH: 31.5 pg (ref 26.0–34.0)
MCHC: 34.4 g/dL (ref 30.0–36.0)
MCV: 91.5 fL (ref 80.0–100.0)
Platelets: 107 10*3/uL — ABNORMAL LOW (ref 150–400)
RBC: 4.57 MIL/uL (ref 3.87–5.11)
RDW: 13.1 % (ref 11.5–15.5)
WBC: 5.6 10*3/uL (ref 4.0–10.5)
nRBC: 0 % (ref 0.0–0.2)

## 2023-09-08 LAB — BASIC METABOLIC PANEL
Anion gap: 9 (ref 5–15)
BUN: 8 mg/dL (ref 6–20)
CO2: 27 mmol/L (ref 22–32)
Calcium: 8.6 mg/dL — ABNORMAL LOW (ref 8.9–10.3)
Chloride: 97 mmol/L — ABNORMAL LOW (ref 98–111)
Creatinine, Ser: 0.76 mg/dL (ref 0.44–1.00)
GFR, Estimated: >60 mL/min (ref >60)
Glucose, Bld: 360 mg/dL — ABNORMAL HIGH (ref 70–99)
Potassium: 3.6 mmol/L (ref 3.5–5.1)
Sodium: 133 mmol/L — ABNORMAL LOW (ref 135–145)

## 2023-09-08 LAB — TROPONIN I (HIGH SENSITIVITY)
Troponin I (High Sensitivity): 5 ng/L (ref <18)
Troponin I (High Sensitivity): 5 ng/L (ref <18)

## 2023-09-08 LAB — URINALYSIS, ROUTINE W REFLEX MICROSCOPIC
Bilirubin Urine: NEGATIVE
Glucose, UA: 100 mg/dL — AB
Ketones, ur: NEGATIVE mg/dL
Nitrite: NEGATIVE
Protein, ur: 30 mg/dL — AB
Specific Gravity, Urine: 1.025 (ref 1.005–1.030)
pH: 6 (ref 5.0–8.0)

## 2023-09-08 LAB — URINALYSIS, MICROSCOPIC (REFLEX)

## 2023-09-08 LAB — TSH: TSH: 1.961 u[IU]/mL (ref 0.350–4.500)

## 2023-09-08 LAB — CBG MONITORING, ED
Glucose-Capillary: 330 mg/dL — ABNORMAL HIGH (ref 70–99)
Glucose-Capillary: 308 mg/dL — ABNORMAL HIGH (ref 70–99)

## 2023-09-08 MED ORDER — SODIUM CHLORIDE 0.9 % IV BOLUS
1000.0000 mL | Freq: Once | INTRAVENOUS | Status: AC
  Administered 2023-09-08: 1000 mL via INTRAVENOUS

## 2023-09-08 MED ORDER — NOVOLIN 70/30 (70-30) 100 UNIT/ML ~~LOC~~ SUSP: 12.0000 [IU] | Freq: Two times a day (BID) | SUBCUTANEOUS | 11 refills | Status: DC

## 2023-09-08 MED ORDER — METRONIDAZOLE 500 MG PO TABS
500.0000 mg | ORAL_TABLET | Freq: Two times a day (BID) | ORAL | 0 refills | Status: DC
  Filled 2023-09-08: qty 14, 7d supply, fill #0

## 2023-09-08 MED ORDER — BLOOD GLUCOSE MONITOR SYSTEM W/DEVICE KIT
1.0000 | PACK | Freq: Three times a day (TID) | 0 refills | Status: DC
  Filled 2023-09-08: qty 1, 30d supply, fill #0

## 2023-09-08 MED ORDER — POTASSIUM CHLORIDE CRYS ER 20 MEQ PO TBCR
40.0000 meq | EXTENDED_RELEASE_TABLET | Freq: Once | ORAL | Status: AC
  Administered 2023-09-08: 40 meq via ORAL
  Filled 2023-09-08: qty 2

## 2023-09-08 MED ORDER — INSULIN SYRINGE 29G X 1/2" 1 ML MISC
1.0000 | Freq: Two times a day (BID) | 0 refills | Status: DC
  Filled 2023-09-08: qty 100, 30d supply, fill #0

## 2023-09-08 MED ORDER — BLOOD GLUCOSE TEST VI STRP: 1.0000 | ORAL_STRIP | Freq: Three times a day (TID) | 0 refills | Status: DC

## 2023-09-08 MED ORDER — MECLIZINE HCL 25 MG PO TABS
25.0000 mg | ORAL_TABLET | Freq: Once | ORAL | Status: AC
  Administered 2023-09-08: 25 mg via ORAL
  Filled 2023-09-08: qty 1

## 2023-09-08 MED ORDER — LANCETS MISC. MISC: 1.0000 | Freq: Three times a day (TID) | 0 refills | Status: DC

## 2023-09-08 MED ORDER — INSULIN ASPART 100 UNIT/ML IJ SOLN
6.0000 [IU] | Freq: Once | INTRAMUSCULAR | Status: AC
  Administered 2023-09-08: 6 [IU] via SUBCUTANEOUS

## 2023-09-08 MED ORDER — ACCU-CHEK SOFTCLIX LANCETS MISC
1.0000 | Freq: Three times a day (TID) | 0 refills | Status: AC
  Filled 2023-09-08: qty 100, 30d supply, fill #0

## 2023-09-08 MED ORDER — INSULIN SYRINGE 29G X 1/2" 1 ML MISC: 1.0000 | Freq: Two times a day (BID) | 0 refills | Status: DC

## 2023-09-08 MED ORDER — LANCET DEVICE MISC
1.0000 | Freq: Three times a day (TID) | 0 refills | Status: AC
  Filled 2023-09-08: qty 1, 30d supply, fill #0

## 2023-09-08 MED ORDER — BLOOD GLUCOSE MONITORING SUPPL DEVI: 1.0000 | Freq: Three times a day (TID) | 0 refills | Status: DC

## 2023-09-08 MED ORDER — INSULIN NPH (HUMAN) (ISOPHANE) 100 UNIT/ML ~~LOC~~ SUSP: 15.0000 [IU] | Freq: Two times a day (BID) | SUBCUTANEOUS | 11 refills | Status: DC

## 2023-09-08 MED ORDER — MECLIZINE HCL 25 MG PO TABS
25.0000 mg | ORAL_TABLET | Freq: Three times a day (TID) | ORAL | 0 refills | Status: DC | PRN
  Filled 2023-09-08: qty 30, 10d supply, fill #0

## 2023-09-08 MED ORDER — METRONIDAZOLE 500 MG PO TABS: 500.0000 mg | ORAL_TABLET | Freq: Two times a day (BID) | ORAL | 0 refills | Status: DC

## 2023-09-08 MED ORDER — MECLIZINE HCL 25 MG PO TABS: 25.0000 mg | ORAL_TABLET | Freq: Three times a day (TID) | ORAL | 0 refills | Status: DC | PRN

## 2023-09-08 MED ORDER — NOVOLIN 70/30 (70-30) 100 UNIT/ML ~~LOC~~ SUSP
12.0000 [IU] | Freq: Two times a day (BID) | SUBCUTANEOUS | 11 refills | Status: DC
  Filled 2023-09-08: qty 10, 30d supply, fill #0

## 2023-09-08 MED ORDER — BLOOD GLUCOSE TEST VI STRP
1.0000 | ORAL_STRIP | Freq: Three times a day (TID) | 0 refills | Status: AC
  Filled 2023-09-08 (×2): qty 100, 30d supply, fill #0

## 2023-09-08 MED ORDER — LANCET DEVICE MISC: 1.0000 | Freq: Three times a day (TID) | 0 refills | Status: DC

## 2023-09-08 NOTE — ED Notes (Signed)

## 2023-09-08 NOTE — Discharge Instructions (Signed)
 As discussed, will begin you on Novolin  70/30 for treatment of your diabetes.  Will also send in glucometer as well as test trips to monitor blood sugar.  Recommend follow-up with your primary care for continued management of your blood glucose.  Regarding dizziness, suspect this is likely secondary to peripheral vertigo.  Will attach information for ENT to follow-up with.  Your heart enzymes are normal and EKG appeared normal; does not appear like you are having a heart attack.  Chest x-ray did not show pneumonia, collapsed lung or other abnormality.  Please do not hesitate to return if the worrisome signs and symptoms we discussed become apparent.

## 2023-09-08 NOTE — ED Notes (Signed)
 Pt on the phone with Diabetes RN, Carollee Herter, borrowing PA phone.

## 2023-09-08 NOTE — ED Provider Notes (Signed)
 [place] [facility] AT [facility] Provider Note   CSN: [id number] Arrival date & time: 09/08/23  1028     History  Chief Complaint  Patient presents with   Hyperglycemia    Samantha [name] is a 58 y.o. female.   Hyperglycemia   58 year old female presents emergency department with complaints of elevated blood sugar, dizziness, increased thirst/urination.  Patient states that she has been feeling generally more tired over the past month or 2.  States that she is unsure exactly why.  Noted onset of feelings of dizziness, increased thirst/urination on Friday she checked her blood sugar and it was in the 400s.  States that she is on Actos  for treatment of her hyperglycemia and she has been on that for about a year and a half.  States that she has not checked her blood glucose in several months prior to Friday.  Regarding dizziness, described as room spinning dizziness worsened with positional changes of the head and relieved with rest.  Denies history of similar symptoms in the past.  Denies any changes in vision, facial droop, difficulty swallowing/speaking weakness/sensory deficits in upper or lower extremities, gait abnormalities, not experiencing dizziness.  Patient also reports episode of chest pain about an hour prior to arrival that lasted for 20 to 30 minutes.  States that she was sitting on the couch when she noticed left-sided chest pain without radiation.  Denied any accompanying shortness of breath, cough.  States she is currently without any pain.  Called her primary care who told her to come to the emergency department for further assessment regarding elevated blood glucose with concerns for ketoacidosis.  Past medical history significant for diabetes mellitus type 2 on pioglitazone , CVA, seizure, hypertension, GERD, chronic back pain  Home Medications Prior to Admission medications   Medication Sig Start Date End Date Taking? Authorizing Provider   albuterol  (VENTOLIN  HFA) 108 (90 Base) MCG/ACT inhaler Inhale 1-2 puffs into the lungs every 6 (six) hours as needed for wheezing or shortness of breath. 11/02/22   Christopher Savannah, PA-C  amLODipine  (NORVASC ) 10 MG tablet TAKE ONE TABLET BY MOUTH DAILY 10/23/22   Alvan Dorothyann JONETTA, MD  Evolocumab  (REPATHA ) 140 MG/ML SOSY Inject 140 mg into the skin every 14 (fourteen) days. 09/03/22   Alvan Dorothyann JONETTA, MD  metoCLOPramide  (REGLAN ) 5 MG tablet Take 1 tablet (5 mg total) by mouth 3 (three) times daily before meals. 12/20/21   Armbruster, Elspeth SQUIBB, MD  omeprazole  (PRILOSEC) 40 MG capsule Take 1 capsule (40 mg total) by mouth daily. 08/26/22   Alvan Dorothyann JONETTA, MD  ondansetron  (ZOFRAN ) 8 MG tablet Take 1 tablet (8 mg total) by mouth every 8 (eight) hours as needed for nausea or vomiting. 08/26/22   Alvan Dorothyann JONETTA, MD  oxyCODONE -acetaminophen  (PERCOCET) 10-325 MG tablet Take 1 tablet by mouth 4 (four) times daily as needed. 09/13/21   [provider]  pioglitazone  (ACTOS ) 30 MG tablet Take 1 tablet (30 mg total) by mouth daily. 08/26/22   Alvan Dorothyann JONETTA, MD  predniSONE  (DELTASONE ) 20 MG tablet Take 2 tablets daily with breakfast. 11/02/22   Christopher Savannah, PA-C  promethazine -dextromethorphan (PROMETHAZINE -DM) 6.25-15 MG/5ML syrup Take 5 mLs by mouth 3 (three) times daily as needed for cough. 11/02/22   Christopher Savannah, PA-C  venlafaxine  XR (EFFEXOR -XR) 150 MG 24 hr capsule Take 1 capsule (150 mg total) by mouth daily with breakfast. Short supply until mail order 12/12/22   Alvan Dorothyann JONETTA, MD  Vitamin D , Ergocalciferol , (  DRISDOL ) 1.25 MG (50000 UNIT) CAPS capsule Take 1 capsule (50,000 Units total) by mouth once a week. 03/28/22   Alvan Dorothyann BIRCH, MD      Allergies    Ace inhibitors, Atorvastatin , Metformin  and related, Beta adrenergic blockers, and Glipizide     Review of Systems   Review of Systems  All other systems reviewed and are negative.   Physical Exam Updated  Vital Signs BP (!) 153/69 (BP Location: Left Arm)   Pulse 69   Temp 97.8 F (36.6 C)   Resp 18   Wt 92 kg   LMP  (LMP Unknown)   SpO2 97%   BMI 37.10 kg/m  Physical Exam Vitals and nursing note reviewed.  Constitutional:      General: She is not in acute distress.    Appearance: She is well-developed.  HENT:     Head: Normocephalic and atraumatic.     Right Ear: Tympanic membrane normal.     Left Ear: Tympanic membrane normal.  Eyes:     Conjunctiva/sclera: Conjunctivae normal.  Cardiovascular:     Rate and Rhythm: Normal rate and regular rhythm.     Heart sounds: No murmur heard. Pulmonary:     Effort: Pulmonary effort is normal. No respiratory distress.     Breath sounds: Normal breath sounds. No wheezing, rhonchi or rales.  Abdominal:     Palpations: Abdomen is soft.     Tenderness: There is no abdominal tenderness.  Musculoskeletal:        General: No swelling.     Cervical back: Neck supple.  Skin:    General: Skin is warm and dry.     Capillary Refill: Capillary refill takes less than 2 seconds.  Neurological:     Mental Status: She is alert.     Comments: Alert and oriented to self, place, time and event.   Speech is fluent, clear without dysarthria or dysphasia.   Strength 5/5 in upper/lower extremities   Sensation intact in upper/lower extremities   Normal gait.  Patient able to ambulate independently without obvious gait deviation.  When walking back to the bed and turning to sit down on the bed, noticed worsening room spinning dizziness.  Hesitate for second before she sat up down.  Rightward beating horizontal nystagmus appreciated.  Sat down in bed and symptoms resolved after a matter of seconds.  No pronator drift.  Normal finger-to-nose and feet tapping.  CN I not tested  CN II not tested CN III, IV, VI PERRLA and EOMs intact bilaterally.  Rightward beating horizontal nystagmus appreciated on EOMs. CN V Intact sensation to sharp and light touch to  the face  CN VII facial movements symmetric  CN VIII not tested  CN IX, X no uvula deviation, symmetric rise of soft palate  CN XI 5/5 SCM and trapezius strength bilaterally  CN XII Midline tongue protrusion, symmetric L/R movements     Psychiatric:        Mood and Affect: Mood normal.     ED Results / Procedures / Treatments   Labs (all labs ordered are listed, but only abnormal results are displayed) Labs Reviewed  CBG MONITORING, ED - Abnormal; Notable for the following components:      Result Value   Glucose-Capillary 330 (*)    All other components within normal limits  BASIC METABOLIC PANEL  CBC  URINALYSIS, ROUTINE W REFLEX MICROSCOPIC  CBG MONITORING, ED    EKG None  Radiology No results found.  Procedures Procedures  Medications Ordered in ED Medications  sodium chloride  0.9 % bolus 1,000 mL (has no administration in time range)    ED Course/ Medical Decision Making/ A&P                                 Medical Decision Making Amount and/or Complexity of Data Reviewed Labs: ordered. Radiology: ordered.  Risk OTC drugs. Prescription drug management.   This patient presents to the ED for concern of elevated blood glucose, dizziness, chest pain, this involves an extensive number of treatment options, and is a complaint that carries with it a high risk of complications and morbidity.  The differential diagnosis includes hyperglycemia, DKA, HHS, DPPV, , Mnire's disease, labyrinthitis, CVA, vertebrobasilar insufficiency, ACS, PE, pneumothorax, GERD, musculoskeletal, or dissection, other   Co morbidities that complicate the patient evaluation  See HPI   Additional history obtained:  Additional history obtained from EMR External records from outside source obtained and reviewed including hospital records   Lab Tests:  I Ordered, and personally interpreted labs.  The pertinent results include: No leukocytosis.  No evidence of anemia.   Thrombocytopenia of 107.  Pseudohyponatremia of 133 with a glucose of 360.  No renal dysfunction.  UA with trichomonas present, small leukocytosis, many bacteria, 11-20 WBCs and 6010 RBCs; urine culture sent.  TSH within normal limits.  Troponin of 5 with repeat 5   Imaging Studies ordered:  I ordered imaging studies including chest x-ray I independently visualized and interpreted imaging which showed no acute cardiopulmonary abnormality. I agree with the radiologist interpretation   Cardiac Monitoring: / EKG:  The patient was maintained on a cardiac monitor.  I personally viewed and interpreted the cardiac monitored which showed an underlying rhythm of: Sinus rhythm   Consultations Obtained:  N/a   Problem List / ED Course / Critical interventions / Medication management  Hyperglycemia, chest pain, dizziness I ordered medication including potassium chloride , meclizine , normal saline, NovoLog    Reevaluation of the patient after these medicines showed that the patient improved I have reviewed the patients home medicines and have made adjustments as needed   Social Determinants of Health:  Chronic cigarette use.  Denies illicit drug use.   Test / Admission - Considered:  Hyperglycemia, chest pain, dizziness Vitals signs significant for hypertension blood pressure 154/62. Otherwise within normal range and stable throughout visit. Laboratory/imaging studies significant for: See above 58 year old female presents emergency department with a few difficult complaints.  Patient reports increased blood sugar/thirst/urination as well as feelings of dizziness and brief episode of chest pain that began earlier today.   Regarding elevated blood sugar, patient on monotherapy of Actos  mainly due to medication intolerance in the past after trialing multiple medicines.  Has been on monotherapy Actos  for the past year and 1/2+ and has just recently started checking her sugar when she began to be  more symptomatic since Friday.  Suspect the patient's sugars have been elevated more chronically as she has felt more tired with increased urination/thirst for the past 2+ months.  Patient without evidence currently of DKA/HHS.  Blood sugar initially 360 in the ED with improved with IV fluids.  Consulted diabetes coordinator regarding patient's hyperglycemia and intolerance of multiple medications in the past.  Has tolerated sliding scale of insulin .  Diabetes coordinator recommended Novolin  70/30 12 units twice daily with meals.  Will begin medications and fill patient's glucometer/supplies and have her follow-up with primary care. Regarding  chest pain, somewhat brief episode when she was sitting down at home earlier today.  Not necessarily worsened with exertion.  Lasted 2030 minutes before spontaneous resolution.  No complete shortness of breath.  No radiation of pain.  Regarding chest pain, workup reassuring.  Delta negative troponin, lack of acute ischemic change on EKG; low suspicion for ACS.  Patient without chest pain rating to back, pulse deficits, neurologic deficits, significant hypertension; low suspicion for aortic dissection.  Patient with low Pesi score; low suspicion for PE.  Chest x-ray without obvious pneumonia, pneumothorax, pulmonary vas congestion or other abnormality.  Unsure of exact etiology of patient's chest pain but seems to be not be emergent.  Recommend follow-up with PCP/cardiology in the outpatient setting.   Regarding dizziness, worsened with positional changes relieved with rest.  Described as room spinning dizziness.  Acute onset.  Nonfocal cerebellar exam with rightward beating horizontal nystagmus.  Able to ambulate without difficulty with normal finger-nose, heel-to-shin with negative pronator drift.  Upon chart review, similar presentation with primary care in the past that was somewhat transient the patient ever had follow-up.  Treated with meclizine  and noted resolution of  perceived room spinning dizziness.  Suspect peripheral cause of vertigo.  Recommend follow-up with ENT in the outpatient setting. Treatment plan gust at length with patient and she acknowledged understanding was agreeable to said plan.  Patient overall well-appearing, afebrile in no acute distress. Worrisome signs and symptoms were discussed with the patient, and the patient acknowledged understanding to return to the ED if noticed. Patient was stable upon discharge.          Final Clinical Impression(s) / ED Diagnoses Final diagnoses:  None    Rx / DC Orders ED Discharge Orders     None         Silver Wonda LABOR, [place] 09/09/23 1706    Elnor Bernarda SQUIBB, DO 09/10/23 [phone]

## 2023-09-08 NOTE — Telephone Encounter (Signed)
 Copied from CRM [id number]. Topic: Clinical - Pink Word Triage >> Sep 08, 2023  8:22 AM Curlee DEL wrote: Reason for Triage: Patient experiencing a blood sugar of over 400 and the other night, it was closer to 500.  Chief Complaint: elevated bgl Symptoms: 302 this am Frequency: for the past few days Pertinent Negatives: Patient denies n/v; fever Disposition: [] ED /[] Urgent Care (no appt availability in office) / [x] Appointment(In office/virtual)/ []  [facility]/ [] Home Care/ [] Refused Recommended Disposition /[] [facility]/ []  Follow-up with PCP Additional Notes: states has not been feeleing well for the past few days.  States she decided to check bgl and noticed it was high.  Patient currently takes actos  and no insulin .  Apt made for this am. Care advice reviewed with patient, denies questions and able to teach back.  PCP office updated.  Reason for Disposition  [1] Blood glucose > 240 mg/dL (86.6 mmol/L) AND [7] pregnant  Answer Assessment - Initial Assessment Questions 1. BLOOD GLUCOSE: What is your blood glucose level?      302; went to bed last night it was 411. 2. ONSET: When did you check the blood glucose?     machine 3. USUAL RANGE: What is your glucose level usually? (e.g., usual fasting morning value, usual evening value)     I don't usually check it 4. KETONES: Do you check for ketones (urine or blood test strips)? If Yes, ask: What does the test show now?      no 5. TYPE 1 or 2:  Do you know what type of diabetes you have?  (e.g., Type 1, Type 2, Gestational; doesn't know)      2 6. INSULIN : Do you take insulin ? What type of insulin (s) do you use? What is the mode of delivery? (syringe, pen; injection or pump)?      Actos ; no insulin  7. DIABETES PILLS: Do you take any pills for your diabetes? If Yes, ask: Have you missed taking any pills recently?     actos  8. OTHER SYMPTOMS: Do you have any symptoms? (e.g., fever, frequent urination,  difficulty breathing, dizziness, weakness, vomiting)     dizziness  Protocols used: Diabetes - High Blood Sugar-A-AH [id number]

## 2023-09-08 NOTE — Inpatient Diabetes Management (Addendum)
 Inpatient Diabetes Program Recommendations  AACE/ADA: New Consensus Statement on Inpatient Glycemic Control (2015)  Target Ranges:  Prepandial:   less than 140 mg/dL      Peak postprandial:   less than 180 mg/dL (1-2 hours)      Critically ill patients:  140 - 180 mg/dL   Lab Results  Component Value Date   GLUCAP 330 (H) 09/08/2023   HGBA1C 6.8 (A) 08/26/2022    Review of Glycemic Control  Diabetes history: DM 2 Outpatient Diabetes medications: Actos  30 mg Daily ( has been on Metformin  and glipizide  in the past with side effects, also has been on 70/30 and NPH insulins in the past) Current orders for Inpatient glycemic control:  Novolog  6 units once  Last A1c per pt report was around a 7%. Discussed with pt consistently checking glucose trends even if levels are within normal range. Pt has plenty of glucose monitoring supplies at home. Pt sees her PCP every 3-6 months. 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   Getting pharmacy to assist with benefits check on insulin  as I am not familiar with pt insurance.  Weight based on 70/30 insulin  (0.2 units/kg) 12 units bid, ED provider to adjust and place pt on 15 units bid, which is closer to 0.3 units/kg.   Per Pharmacy: Humulin 70/30 pens or vial has a $35 copayNovolin  70/30 vial has a $47.92 copay and the pens are $65  Thanks,  Clotilda Bull RN, MSN, BC-ADM Inpatient Diabetes Coordinator Team Pager [phone] (8a-5p)

## 2023-09-08 NOTE — Telephone Encounter (Signed)
 Pharmacy Patient Advocate Encounter  Insurance verification completed.    The patient is insured through  [facility] [employer] . Patient has [employer], may use a copay card, and/or apply for patient assistance if available.    Ran test claim for Humulin 70/30 pen and the current 30 day co-pay is $35. Ran test claim for Humulin 70/30 vial and the current 30 day co-pay is $35. Ran test claim for Novolin  70/30 pen and the current 30 day co-pay is $65. Ran test claim for Novolin  70/30 vial and the current 30 day co-pay is $47.92   This test claim was processed through [employer]- copay amounts may vary at other pharmacies due to [employer], or as the patient moves through the different stages of their insurance plan.

## 2023-09-08 NOTE — ED Triage Notes (Addendum)
 Pt reports blood sugar has been elevated for the last 4 days. Blood sugar not under 350. Feeling tired over one month. Feeling dizzy, Increase thirst and urination. Takes actos  for blood sugar. Chest pain this am approx 1 hour and has resolved. Denies any cold symptoms. Takes actos  for DM

## 2023-09-09 ENCOUNTER — Other Ambulatory Visit (HOSPITAL_BASED_OUTPATIENT_CLINIC_OR_DEPARTMENT_OTHER): Payer: Self-pay

## 2023-09-14 ENCOUNTER — Encounter: Payer: Self-pay | Admitting: Family Medicine

## 2023-09-14 ENCOUNTER — Ambulatory Visit (INDEPENDENT_AMBULATORY_CARE_PROVIDER_SITE_OTHER): Payer: No Typology Code available for payment source | Admitting: Family Medicine

## 2023-09-14 VITALS — BP 131/58 | HR 65 | Ht 62.0 in | Wt 205.0 lb

## 2023-09-14 DIAGNOSIS — D518 Other vitamin B12 deficiency anemias: Secondary | ICD-10-CM | POA: Diagnosis not present

## 2023-09-14 DIAGNOSIS — Z794 Long term (current) use of insulin: Secondary | ICD-10-CM

## 2023-09-14 DIAGNOSIS — E782 Mixed hyperlipidemia: Secondary | ICD-10-CM

## 2023-09-14 DIAGNOSIS — G4700 Insomnia, unspecified: Secondary | ICD-10-CM

## 2023-09-14 DIAGNOSIS — I10 Essential (primary) hypertension: Secondary | ICD-10-CM | POA: Diagnosis not present

## 2023-09-14 DIAGNOSIS — E119 Type 2 diabetes mellitus without complications: Secondary | ICD-10-CM

## 2023-09-14 DIAGNOSIS — E1165 Type 2 diabetes mellitus with hyperglycemia: Secondary | ICD-10-CM

## 2023-09-14 LAB — POCT GLYCOSYLATED HEMOGLOBIN (HGB A1C): Hemoglobin A1C: 10.4 % — AB (ref 4.0–5.6)

## 2023-09-14 MED ORDER — FREESTYLE LIBRE 3 SENSOR MISC: 4 refills | Status: AC

## 2023-09-14 MED ORDER — ZOLPIDEM TARTRATE 5 MG PO TABS: 5.0000 mg | ORAL_TABLET | Freq: Every evening | ORAL | 1 refills | Status: DC | PRN

## 2023-09-14 MED ORDER — TIRZEPATIDE 2.5 MG/0.5ML ~~LOC~~ SOAJ: 2.5000 mg | SUBCUTANEOUS | 0 refills | Status: DC

## 2023-09-14 NOTE — Assessment & Plan Note (Addendum)
 She would like to see if she could qualify for CGM.  Increase Novolin  70/30 to 15 units twice a day if after 2 days blood sugars still not coming under 200 then increase to 17 units twice a day.  Also going to see if we can start a GLP-1 and get that covered.

## 2023-09-14 NOTE — Assessment & Plan Note (Signed)
 Well controlled. Continue current regimen. Follow up in  6 months.

## 2023-09-14 NOTE — Progress Notes (Addendum)
 Established Patient Office Visit  Subjective  Patient ID: Samantha [name], female    DOB: 03-20-1966  Age: 58 y.o. MRN: [id number]  Chief Complaint  Patient presents with   Diabetes    HPI  Diabetes - no hypoglycemic events. No wounds or sores that are not healing well. No increased thirst or urination. Checking glucose at home. Taking medications as prescribed without any side effects.  Last A1c was a year ago in December 2023 at 6.8.  Even with starting the insulin  her blood sugars have been running in the 200s and 300s.  Glucose was 311 fasting this morning.  She was just seen in the emergency department on January 7 for hyperglycemia.  She had been having high blood sugars has increased thirst and urination she been feeling more tired over the past 2 months.  Also been experiencing some episodes of chest pain.  She was noted to have thrombocytopenia with a platelet count of 107.  Pseudohyponatremia of 133 glucose of 360.  Urinalysis showed trichomonas and many bacteria.  Urine culture was sent.they recommend Novolin  70/30.  Chest pain workup was negative.  Treated with metronidazole .  Labs in the ED showed hyponatremia and low calcium .  Will recheck labs today I do think she was in ketosis in the ED.  Also been struggling with her weight she is between 197 and 204 and cannot casing to get past that.  When she [place] first moved to the area she weighed about 250 and so was able to get about 45 pounds off but has not been able to move past that so she is interested in a GLP-1 to help with her diabetes, reduce her insulin  need and help with weight loss.  ROS    Objective:     BP (!) 131/58   Pulse 65   Ht 5' 2 (1.575 m)   Wt 205 lb (93 kg)   LMP  (LMP Unknown)   SpO2 100%   BMI 37.49 kg/m    Physical Exam Vitals and nursing note reviewed.  Constitutional:      Appearance: Normal appearance.  HENT:     Head: Normocephalic and atraumatic.  Eyes:     Conjunctiva/sclera:  Conjunctivae normal.  Cardiovascular:     Rate and Rhythm: Normal rate and regular rhythm.  Pulmonary:     Effort: Pulmonary effort is normal.     Breath sounds: Normal breath sounds.  Skin:    General: Skin is warm and dry.  Neurological:     Mental Status: She is alert.  Psychiatric:        Mood and Affect: Mood normal.      Results for orders placed or performed in visit on 09/14/23  POCT HgB A1C  Result Value Ref Range   Hemoglobin A1C 10.4 (A) 4.0 - 5.6 %   HbA1c POC (<> result, manual entry)     HbA1c, POC (prediabetic range)     HbA1c, POC (controlled diabetic range)        The ASCVD Risk score (Arnett DK, et al., 2019) failed to calculate for the following reasons:   Risk score cannot be calculated because patient has a medical history suggesting prior/existing ASCVD    Assessment & Plan:   Problem List Items Addressed This Visit       Cardiovascular and Mediastinum   Essential hypertension   Well controlled. Continue current regimen. Follow up in  6 months.       Relevant Orders  Urine Microalbumin w/creat. ratio   Lipid Panel With LDL/HDL Ratio   B12   BMP8+EGFR     Endocrine   Type 2 diabetes mellitus treated with insulin  (HCC) - Primary   She would like to see if she could qualify for CGM.  Increase Novolin  70/30 to 15 units twice a day if after 2 days blood sugars still not coming under 200 then increase to 17 units twice a day.  Also going to see if we can start a GLP-1 and get that covered.      Relevant Medications   tirzepatide  (MOUNJARO ) 2.5 MG/0.5ML Pen   Continuous Glucose Sensor (FREESTYLE LIBRE 3 SENSOR) MISC     Other   Mixed hyperlipidemia   Relevant Orders   Urine Microalbumin w/creat. ratio   Lipid Panel With LDL/HDL Ratio   B12   BMP8+EGFR   Insomnia   Really struggling with insomnia and sleep issues.  She is also been waking up frequently recently to urinate.  She has tried several of the other counters without improvement.   We did discuss potential side effects of Ambien  to monitor for.  Making sure also that she is getting an adequate 8-hour window.  Will start with 5 mg.  Recommend even split the tab and start with a half of a 5 to make sure that she is doing well with it before going up to a whole tab.      Relevant Medications   zolpidem  (AMBIEN ) 5 MG tablet   B12 deficiency anemia   Relevant Orders   Urine Microalbumin w/creat. ratio   Lipid Panel With LDL/HDL Ratio   B12   BMP8+EGFR    Return in about 6 weeks (around 10/26/2023) for NEW START MEDICATION.    Reviewed hospital/ED notes and labs.  Dorothyann Byars, MD

## 2023-09-14 NOTE — Assessment & Plan Note (Addendum)
 Really struggling with insomnia and sleep issues.  She is also been waking up frequently recently to urinate.  She has tried several of the other counters without improvement.  We did discuss potential side effects of Ambien  to monitor for.  Making sure also that she is getting an adequate 8-hour window.  Will start with 5 mg.  Recommend even split the tab and start with a half of a 5 to make sure that she is doing well with it before going up to a whole tab.

## 2023-09-15 LAB — BMP8+EGFR
BUN/Creatinine Ratio: 15 (ref 9–23)
BUN: 8 mg/dL (ref 6–24)
CO2: 28 mmol/L (ref 20–29)
Calcium: 8.7 mg/dL (ref 8.7–10.2)
Chloride: 99 mmol/L (ref 96–106)
Creatinine, Ser: 0.53 mg/dL — ABNORMAL LOW (ref 0.57–1.00)
Glucose: 215 mg/dL — ABNORMAL HIGH (ref 70–99)
Potassium: 3.3 mmol/L — ABNORMAL LOW (ref 3.5–5.2)
Sodium: 141 mmol/L (ref 134–144)
eGFR: 108 mL/min/{1.73_m2} (ref >59)

## 2023-09-15 LAB — MICROALBUMIN / CREATININE URINE RATIO
Creatinine, Urine: 158.4 mg/dL
Microalb/Creat Ratio: 19 mg/g{creat} (ref 0–29)
Microalbumin, Urine: 30.4 ug/mL

## 2023-09-15 LAB — LIPID PANEL WITH LDL/HDL RATIO
Cholesterol, Total: 161 mg/dL (ref 100–199)
HDL: 32 mg/dL — ABNORMAL LOW (ref >39)
LDL Chol Calc (NIH): 100 mg/dL — ABNORMAL HIGH (ref 0–99)
LDL/HDL Ratio: 3.1 {ratio} (ref 0.0–3.2)
Triglycerides: 167 mg/dL — ABNORMAL HIGH (ref 0–149)
VLDL Cholesterol Cal: 29 mg/dL (ref 5–40)

## 2023-09-15 LAB — VITAMIN B12: Vitamin B-12: 371 pg/mL (ref 232–1245)

## 2023-09-15 NOTE — Progress Notes (Signed)
 Hi [name], your potassium is still a little bit low.  I would really like for you to try to increase your potassium through your diet and then we can recheck it again in about 2 weeks.  If you would prefer me to send in a potassium tab I can but if you can get it through your diet that would be wonderful.  Glucose looks better at 215 and calcium  level was back to normal.  B12 looks normal.  LDL cholesterol is just mildly elevated as well as triglycerides.  Beet greens, cooked 1 cup 39 1309 Swiss chard, cooked 1 cup 35 961 Lima beans (white), cooked* 1 cup 216 955 Potato, baked, with skin 1 medium 161 926 Yam, cooked 1 cup 158 911 Acorn squash, cooked 1 cup 115 896 Spinach, cooked 1 cup 41 839 Breadfruit, cooked 1 cup 170 808 Bamboo shoots, raw 1 cup 41 805 Water chestnuts 1 cup 120 724 Carrot juice, 100% 1 cup 94 689 Taro leaves, cooked 1 cup 35 667 Plantains, cooked 1 cup 215 663 Taro root (dasheen or yautia), cooked 1 cup 187 639 Adzuki beans, cooked* 1/2 cup 147 612 Cress, raw 2 cups 32 606 Butternut squash, cooked 1 cup 82 582 Parsnips, cooked 1 cup 110 572 Sweet potato, cooked

## 2023-09-16 ENCOUNTER — Other Ambulatory Visit: Payer: Self-pay | Admitting: *Deleted

## 2023-09-16 DIAGNOSIS — E876 Hypokalemia: Secondary | ICD-10-CM

## 2023-09-24 ENCOUNTER — Encounter: Payer: Self-pay | Admitting: Family Medicine

## 2023-10-08 ENCOUNTER — Encounter: Payer: Self-pay | Admitting: Family Medicine

## 2023-10-08 DIAGNOSIS — G4700 Insomnia, unspecified: Secondary | ICD-10-CM

## 2023-10-08 DIAGNOSIS — F339 Major depressive disorder, recurrent, unspecified: Secondary | ICD-10-CM

## 2023-10-08 DIAGNOSIS — E1165 Type 2 diabetes mellitus with hyperglycemia: Secondary | ICD-10-CM

## 2023-10-08 DIAGNOSIS — E876 Hypokalemia: Secondary | ICD-10-CM

## 2023-10-08 DIAGNOSIS — I10 Essential (primary) hypertension: Secondary | ICD-10-CM

## 2023-10-08 DIAGNOSIS — K219 Gastro-esophageal reflux disease without esophagitis: Secondary | ICD-10-CM

## 2023-10-09 MED ORDER — POTASSIUM CHLORIDE CRYS ER 10 MEQ PO TBCR: 10.0000 meq | EXTENDED_RELEASE_TABLET | Freq: Every day | ORAL | 1 refills | Status: DC

## 2023-10-09 NOTE — Telephone Encounter (Signed)
 Meds ordered this encounter  Medications   potassium chloride  (KLOR-CON  M) 10 MEQ tablet    Sig: Take 1 tablet (10 mEq total) by mouth daily.    Dispense:  30 tablet    Refill:  1

## 2023-10-12 NOTE — Telephone Encounter (Signed)
 Attempted call to patient to go over which medications needing refills and to verify strengths and how taking . Left a voice mail message requesting a return call.

## 2023-10-13 ENCOUNTER — Telehealth: Payer: Self-pay | Admitting: Family Medicine

## 2023-10-13 ENCOUNTER — Ambulatory Visit: Payer: No Typology Code available for payment source | Admitting: Family Medicine

## 2023-10-13 DIAGNOSIS — G4700 Insomnia, unspecified: Secondary | ICD-10-CM

## 2023-10-13 MED ORDER — OMEPRAZOLE 40 MG PO CPDR: 40.0000 mg | DELAYED_RELEASE_CAPSULE | Freq: Every day | ORAL | 3 refills | Status: AC

## 2023-10-13 MED ORDER — ZOLPIDEM TARTRATE 10 MG PO TABS
5.0000 mg | ORAL_TABLET | Freq: Every evening | ORAL | 0 refills | Status: DC | PRN
Start: 2023-10-13 — End: 2023-10-13

## 2023-10-13 MED ORDER — ZOLPIDEM TARTRATE 5 MG PO TABS: 5.0000 mg | ORAL_TABLET | Freq: Every evening | ORAL | 1 refills | Status: AC | PRN

## 2023-10-13 MED ORDER — AMLODIPINE BESYLATE 10 MG PO TABS: 10.0000 mg | ORAL_TABLET | Freq: Every day | ORAL | 1 refills | Status: DC

## 2023-10-13 MED ORDER — VENLAFAXINE HCL ER 150 MG PO CP24
150.0000 mg | ORAL_CAPSULE | Freq: Every day | ORAL | 0 refills | Status: DC
Start: 2023-10-13 — End: 2023-10-15

## 2023-10-13 MED ORDER — PIOGLITAZONE HCL 30 MG PO TABS: 30.0000 mg | ORAL_TABLET | Freq: Every day | ORAL | 3 refills | Status: DC

## 2023-10-13 MED ORDER — NOVOLIN 70/30 (70-30) 100 UNIT/ML ~~LOC~~ SUSP: 20.0000 [IU] | Freq: Two times a day (BID) | SUBCUTANEOUS | 11 refills | Status: DC

## 2023-10-13 MED ORDER — MECLIZINE HCL 25 MG PO TABS
25.0000 mg | ORAL_TABLET | Freq: Three times a day (TID) | ORAL | 0 refills | Status: AC | PRN
Start: 2023-10-13 — End: ?

## 2023-10-13 MED ORDER — ONDANSETRON HCL 8 MG PO TABS: 8.0000 mg | ORAL_TABLET | Freq: Three times a day (TID) | ORAL | 1 refills | Status: DC | PRN

## 2023-10-13 NOTE — Telephone Encounter (Signed)
Kim, can you give her an update on the PA.  It look like in your note it was still pending but I was not sure if you had reached out to her to give her that information.  Also I did refill the Ambien with 5 mg because that is now the FDA approved dose for women.  If so if that is not helping we might want to look an alternative such as Lunesta or something off label if needed such as quetiapine.

## 2023-10-13 NOTE — Telephone Encounter (Signed)
Patient requesting rx rf of pended medications  [facility] Patient requesting  Ambien refill as 10mg  - states 5 mg was not working but  Requesting two medications filled by other provider : States using novolin 70/30 20 u BID now And meclizine   Requesting to refill omeprazole 40mg   once daily as prescription was getting OTC Also requesting zofran refill due to  Gastroparesis causing nausea Requesting rx rf of Actos - has not had medication refill for a long time  Patient also questioning about mounjarno PA as she has not heard from anyone  Patient has appt with provider today and is very frustrated that this has not been addressed before today.  Called [employer] to inquire about PA  that was submitted on 10/09/23 still pending EOC# [id number]

## 2023-10-14 NOTE — Telephone Encounter (Signed)
Attempted call to patient. Left a voice mail message requesting a return call.

## 2023-10-15 ENCOUNTER — Telehealth: Payer: Self-pay

## 2023-10-15 DIAGNOSIS — F339 Major depressive disorder, recurrent, unspecified: Secondary | ICD-10-CM

## 2023-10-15 MED ORDER — VENLAFAXINE HCL ER 150 MG PO CP24: 150.0000 mg | ORAL_CAPSULE | Freq: Every day | ORAL | 1 refills | Status: DC

## 2023-10-15 NOTE — Telephone Encounter (Signed)
Medication has been sent to [facility] for 90 day.

## 2023-10-15 NOTE — Telephone Encounter (Signed)
Copied from CRM [id number]. Topic: Clinical - Prescription Issue >> Oct 15, 2023 11:18 AM Desma Mcgregor wrote: Reason for CRM: Venlafaxine XR (EFFEXOR-XR) 150 MG 24 hr capsule. The 5 day supply was sent to the mail order which is [facility]. [facility] the [facility]? Please review and send 90 day supply to [facility] for patient.

## 2023-10-16 NOTE — Telephone Encounter (Signed)
Patient informed of both PA approval and Ambien strength sent as 5mg  and could call to schedule for possible medication change if Ambien not working for her. Left both as detailed messages on patient home # ( allowed on DPR )

## 2023-10-31 NOTE — Telephone Encounter (Signed)
 Prior auth for: [facility] 2.5 MG Determination: APPROVED Auth #: [id number] Valid from: 10/13/23 - 10/13/24 Patient notified via MyChart

## 2023-11-05 ENCOUNTER — Inpatient Hospital Stay: Payer: No Typology Code available for payment source | Admitting: Family Medicine

## 2023-11-09 ENCOUNTER — Other Ambulatory Visit: Payer: Self-pay | Admitting: Family Medicine

## 2023-11-09 DIAGNOSIS — E1165 Type 2 diabetes mellitus with hyperglycemia: Secondary | ICD-10-CM

## 2023-11-16 NOTE — Telephone Encounter (Signed)
  Patient comment: I have an upcoming apt with Matheney on the 20th. I will not have a dose for this coming Sunday. She may change my dosage during my upcoming apt, so could she call in just one injection?   Regarding Mounjaro prescription

## 2023-11-17 MED ORDER — TIRZEPATIDE 5 MG/0.5ML ~~LOC~~ SOAJ: 5.0000 mg | SUBCUTANEOUS | 0 refills | Status: DC

## 2023-11-17 NOTE — Telephone Encounter (Signed)
 Please let her know I did go ahead and bump her dose to the 5 mg.  That way we can move past the starter dose if she is otherwise doing well and then we can regroup when I see her back in a week.  Meds ordered this encounter  Medications   tirzepatide (MOUNJARO) 5 MG/0.5ML Pen    Sig: Inject 5 mg into the skin once a week.    Dispense:  2 mL    Refill:  0

## 2023-11-17 NOTE — Telephone Encounter (Signed)
 Called and left a detailed voice mail message  on home # ( allowed on DPR)

## 2023-11-19 ENCOUNTER — Ambulatory Visit (INDEPENDENT_AMBULATORY_CARE_PROVIDER_SITE_OTHER): Admitting: Family Medicine

## 2023-11-19 ENCOUNTER — Encounter: Payer: Self-pay | Admitting: Family Medicine

## 2023-11-19 VITALS — BP 127/66 | HR 64 | Ht 62.0 in | Wt 212.0 lb

## 2023-11-19 DIAGNOSIS — E119 Type 2 diabetes mellitus without complications: Secondary | ICD-10-CM

## 2023-11-19 DIAGNOSIS — I10 Essential (primary) hypertension: Secondary | ICD-10-CM

## 2023-11-19 DIAGNOSIS — E559 Vitamin D deficiency, unspecified: Secondary | ICD-10-CM

## 2023-11-19 DIAGNOSIS — F411 Generalized anxiety disorder: Secondary | ICD-10-CM

## 2023-11-19 DIAGNOSIS — Z794 Long term (current) use of insulin: Secondary | ICD-10-CM

## 2023-11-19 DIAGNOSIS — G4700 Insomnia, unspecified: Secondary | ICD-10-CM

## 2023-11-19 MED ORDER — VENLAFAXINE HCL ER 37.5 MG PO CP24: 37.5000 mg | ORAL_CAPSULE | Freq: Every day | ORAL | 0 refills | Status: DC

## 2023-11-19 MED ORDER — VITAMIN D (ERGOCALCIFEROL) 1.25 MG (50000 UNIT) PO CAPS: 50000.0000 [IU] | ORAL_CAPSULE | ORAL | 1 refills | Status: DC

## 2023-11-19 NOTE — Assessment & Plan Note (Signed)
 That she still just feels really anxious at times.  She feels like it is just almost to the point where it is causing her to feel overwhelmed at work and disorganized and is making it difficult to focus.  Her sister had suggested that maybe she has ADD as there are some family members with that.  We discussed possibly upping her venlafaxine she has been on this medication for years and does not want to come off of it but would be willing to go up on her dose and will add another 37.5 mg to her 150 mg tab.  Also did have her do an ADD screening.

## 2023-11-19 NOTE — Assessment & Plan Note (Signed)
 He unfortunately had significant difficulty getting a refill on her Greggory Keen she had notified us a couple of weeks ago but never heard back and then the pharmacy had sent some requests.  She did pick the prescription up today but will have a missing week in between.  We did go ahead and bump the dose to 5 mg.  If she tolerates that well then encouraged her to reach back out about 3 weeks so we can bump it up to 7.5 mg.  I am hoping that if she does really well we can work on decreasing the insulin and then maybe even eventually the Actos.  She typically gives between 80

## 2023-11-19 NOTE — Assessment & Plan Note (Signed)
 Well controlled

## 2023-11-19 NOTE — Progress Notes (Unsigned)
 Established Patient Office Visit  Subjective  Patient ID: Samantha [name], female    DOB: Sep 21, 1965  Age: 58 y.o. MRN: [id number]  Chief Complaint  Patient presents with   Diabetes    Pt is taking Mounjaro 2.5 mg weekly. A1c done today 8.4%. she hasn't had an Eye exam and doesn't have one scheduled. Encouraged her to do this.  She would like a refill of her weekly Vitamin D sent to her mail order pharmacy however, she hasn't had this checked. Lab was ordered 2 years ago she never had it done.     HPI  She would also like to discuss her anxiety today it just seems really high lately and it is making it a little bit more difficult at work.  She is also on call when she is not at work so does not really get a lot of downtime.  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  {History (Optional):23778}  ROS    Objective:     BP 127/66   Pulse 64   Ht 5\' 2"  (1.575 m)   Wt 212 lb (96.2 kg)   LMP  (LMP Unknown)   SpO2 98%   BMI 38.78 kg/m  {Vitals History (Optional):23777}  Physical Exam Vitals and nursing note reviewed.  Constitutional:      Appearance: Normal appearance.  HENT:     Head: Normocephalic and atraumatic.  Eyes:     Conjunctiva/sclera: Conjunctivae normal.  Cardiovascular:     Rate and Rhythm: Normal rate and regular rhythm.  Pulmonary:     Effort: Pulmonary effort is normal.     Breath sounds: Normal breath sounds.  Skin:    General: Skin is warm and dry.  Neurological:     Mental Status: She is alert.  Psychiatric:        Mood and Affect: Mood normal.      No results found for any visits on 11/19/23.  {Labs (Optional):23779}  The ASCVD Risk score (Arnett DK, et al., 2019) failed to calculate for the following reasons:   Risk score cannot be calculated because patient has a medical history suggesting prior/existing ASCVD    Assessment & Plan:   Problem List Items Addressed This Visit        Cardiovascular and Mediastinum   Essential hypertension   Well controlled.         Endocrine   Type 2 diabetes mellitus treated with insulin (HCC)   He unfortunately had significant difficulty getting a refill on her Greggory Keen she had notified us a couple of weeks ago but never heard back and then the pharmacy had sent some requests.  She did pick the prescription up today but will have a missing week in between.  We did go ahead and bump the dose to 5 mg.  If she tolerates that well then encouraged her to reach back out about 3 weeks so we can bump it up to 7.5 mg.  I am hoping that if she does really well we can work on decreasing the insulin and then maybe even eventually the Actos.  She typically gives between 105        Other   Vitamin D deficiency - Primary   Relevant Medications   Vitamin D, Ergocalciferol, (DRISDOL) 1.25 MG (50000 UNIT) CAPS capsule   Other Relevant Orders   Vitamin D (25 hydroxy)   Insomnia   Thing Ambien 5 mg she  does feel like her sleep is been a little bit better.      GAD (generalized anxiety disorder)   That she still just feels really anxious at times.  She feels like it is just almost to the point where it is causing her to feel overwhelmed at work and disorganized and is making it difficult to focus.  Her sister had suggested that maybe she has ADD as there are some family members with that.  We discussed possibly upping her venlafaxine she has been on this medication for years and does not want to come off of it but would be willing to go up on her dose and will add another 37.5 mg to her 150 mg tab.  Also did have her do an ADD screening.        Relevant Medications   venlafaxine XR (EFFEXOR XR) 37.5 MG 24 hr capsule    Return in about 3 months (around 02/19/2024) for Diabetes follow-up, Mood.    Nani Gasser, MD

## 2023-11-19 NOTE — Assessment & Plan Note (Signed)
 Using Ambien 5 mg she does feel like her sleep is been a little bit better.

## 2023-11-19 NOTE — Patient Instructions (Signed)
 Will add a 37.5 mg venlafaxine to your 150 mg dose. Can take at the same time or can take separately. Let's see if helpful.

## 2023-11-20 ENCOUNTER — Encounter: Payer: Self-pay | Admitting: Family Medicine

## 2023-11-20 DIAGNOSIS — E559 Vitamin D deficiency, unspecified: Secondary | ICD-10-CM

## 2023-11-20 LAB — VITAMIN D 25 HYDROXY (VIT D DEFICIENCY, FRACTURES): Vit D, 25-Hydroxy: 13.6 ng/mL — ABNORMAL LOW (ref 30.0–100.0)

## 2023-11-20 NOTE — Progress Notes (Signed)
 [name] your vitamin D is extremely low.  I would recommend starting 50 mcg daily with a counter or if you would prefer to do the once a week prescription version I am happy to send that over whichever 1 you feel comfortable with just let me know.  We can recheck your level in 3 months to make sure it is coming up nicely.

## 2023-11-24 ENCOUNTER — Other Ambulatory Visit (HOSPITAL_COMMUNITY): Payer: Self-pay

## 2023-11-24 NOTE — Telephone Encounter (Signed)
 No orders of the defined types were placed in this encounter.

## 2023-12-21 ENCOUNTER — Other Ambulatory Visit: Payer: Self-pay | Admitting: Family Medicine

## 2023-12-28 NOTE — Telephone Encounter (Signed)
 Prior auth for: [facility] 2.5 MG Determination: APPROVED Auth #: [id number] Valid from: 10/13/23 - 10/13/24 Patient notified via MyChart

## 2024-02-02 ENCOUNTER — Other Ambulatory Visit: Payer: Self-pay | Admitting: Family Medicine

## 2024-02-03 NOTE — Telephone Encounter (Signed)
 Please call pt and have her schedule an appointment for DM /mood and medication refills

## 2024-02-03 NOTE — Telephone Encounter (Signed)
Called patient, LVM to call office to schedule appointment, thanks.  

## 2024-02-04 NOTE — Telephone Encounter (Signed)
 Please call pt again for scheduling. Thanks!!

## 2024-02-04 NOTE — Telephone Encounter (Signed)
 Called patient, LVM, will c/b again later, thanks.

## 2024-02-19 ENCOUNTER — Ambulatory Visit: Admitting: Family Medicine

## 2024-03-09 ENCOUNTER — Other Ambulatory Visit: Payer: Self-pay | Admitting: Family Medicine

## 2024-03-11 ENCOUNTER — Ambulatory Visit: Admitting: Physician Assistant

## 2024-03-11 DIAGNOSIS — E559 Vitamin D deficiency, unspecified: Secondary | ICD-10-CM

## 2024-03-11 DIAGNOSIS — F411 Generalized anxiety disorder: Secondary | ICD-10-CM

## 2024-03-11 DIAGNOSIS — E119 Type 2 diabetes mellitus without complications: Secondary | ICD-10-CM

## 2024-03-11 DIAGNOSIS — I10 Essential (primary) hypertension: Secondary | ICD-10-CM

## 2024-03-19 ENCOUNTER — Other Ambulatory Visit: Payer: Self-pay | Admitting: Family Medicine

## 2024-03-22 ENCOUNTER — Encounter: Payer: Self-pay | Admitting: Family Medicine

## 2024-03-22 DIAGNOSIS — E119 Type 2 diabetes mellitus without complications: Secondary | ICD-10-CM

## 2024-03-23 MED ORDER — TIRZEPATIDE 7.5 MG/0.5ML ~~LOC~~ SOAJ
7.5000 mg | SUBCUTANEOUS | 0 refills | Status: AC
Start: 2024-03-23 — End: ?

## 2024-04-11 ENCOUNTER — Ambulatory Visit: Admitting: Family Medicine

## 2024-04-21 ENCOUNTER — Encounter: Payer: Self-pay | Admitting: Family Medicine

## 2024-04-21 ENCOUNTER — Ambulatory Visit (INDEPENDENT_AMBULATORY_CARE_PROVIDER_SITE_OTHER): Admitting: Family Medicine

## 2024-04-21 VITALS — BP 128/67 | HR 65 | Ht 62.0 in | Wt 183.0 lb

## 2024-04-21 DIAGNOSIS — E119 Type 2 diabetes mellitus without complications: Secondary | ICD-10-CM

## 2024-04-21 DIAGNOSIS — Z1231 Encounter for screening mammogram for malignant neoplasm of breast: Secondary | ICD-10-CM | POA: Diagnosis not present

## 2024-04-21 DIAGNOSIS — Z794 Long term (current) use of insulin: Secondary | ICD-10-CM

## 2024-04-21 DIAGNOSIS — I10 Essential (primary) hypertension: Secondary | ICD-10-CM

## 2024-04-21 DIAGNOSIS — Z1211 Encounter for screening for malignant neoplasm of colon: Secondary | ICD-10-CM | POA: Diagnosis not present

## 2024-04-21 DIAGNOSIS — E559 Vitamin D deficiency, unspecified: Secondary | ICD-10-CM

## 2024-04-21 DIAGNOSIS — Z720 Tobacco use: Secondary | ICD-10-CM

## 2024-04-21 DIAGNOSIS — Z0100 Encounter for examination of eyes and vision without abnormal findings: Secondary | ICD-10-CM

## 2024-04-21 DIAGNOSIS — F411 Generalized anxiety disorder: Secondary | ICD-10-CM

## 2024-04-21 LAB — POCT GLYCOSYLATED HEMOGLOBIN (HGB A1C): Hemoglobin A1C: 5.7 % — AB (ref 4.0–5.6)

## 2024-04-21 MED ORDER — CLONAZEPAM 0.25 MG PO TBDP: 0.2500 mg | ORAL_TABLET | Freq: Every day | ORAL | 0 refills | Status: DC | PRN

## 2024-04-21 NOTE — Assessment & Plan Note (Signed)
 BP looks great!!

## 2024-04-21 NOTE — Assessment & Plan Note (Signed)
 Due to recheck Vit D

## 2024-04-21 NOTE — Assessment & Plan Note (Signed)
 Has use clonazepam  PRN in the past. Would like to have rx to use pren. Continue Effexor 

## 2024-04-21 NOTE — Progress Notes (Signed)
 Established Patient Office Visit  Subjective  Patient ID: Samantha [name], female    DOB: 1965/12/31  Age: 58 y.o. MRN: [id number]  Chief Complaint  Patient presents with   Diabetes   Depression    HPI  Discussed the use of AI scribe software for clinical note transcription with the patient, who gave verbal consent to proceed.  History of Present Illness Samantha [name] is a 58 year old female with diabetes who presents for follow-up on her diabetes management.  Glycemic control and antidiabetic medication use - Diabetes mellitus with current A1c of 5.7%. - Discontinued Novolog  and Actos ; currently using Mounjaro  monotherapy. - Weight now in the 180s, lowest since high school. - Overall improved sense of well-being.  Gastrointestinal symptoms related to mounjaro  - After increasing Mounjaro  dose to 7.5 mg (due to insurance issues with 10 mg dose), experienced nausea and vomiting for two weeks, attributed to abrupt dose change and medication gap. - Ongoing occasional nausea, managed with Zofran . - Nausea not severe enough to interfere with work.  Appetite suppression and nutritional concerns - Decreased appetite and difficulty eating regular meals. - Frequently skips breakfast and often misses lunch due to busy schedule. - Consumes only small portions when eating. - Concerned about inadequate nutrient intake; considering multivitamin or supplement for nutritional support.  Tobacco use and lung cancer screening - Current smoker; finds quitting difficult due to ritualistic aspects and enjoyment of smoking breaks. - Open to lung cancer screening due to smoking history.     ROS    Objective:     BP 128/67   Pulse 65   Ht 5' 2 (1.575 m)   Wt 183 lb 0.6 oz (83 kg)   LMP  (LMP Unknown)   SpO2 100%   BMI 33.48 kg/m    Physical Exam Vitals and nursing note reviewed.  Constitutional:      Appearance: Normal appearance.  HENT:     Head: Normocephalic and  atraumatic.  Eyes:     Conjunctiva/sclera: Conjunctivae normal.  Cardiovascular:     Rate and Rhythm: Normal rate and regular rhythm.  Pulmonary:     Effort: Pulmonary effort is normal.     Breath sounds: Normal breath sounds.  Skin:    General: Skin is warm and dry.  Neurological:     Mental Status: She is alert.  Psychiatric:        Mood and Affect: Mood normal.      Results for orders placed or performed in visit on 04/21/24  POCT HgB A1C  Result Value Ref Range   Hemoglobin A1C 5.7 (A) 4.0 - 5.6 %   HbA1c POC (<> result, manual entry)     HbA1c, POC (prediabetic range)     HbA1c, POC (controlled diabetic range)        The ASCVD Risk score (Arnett DK, et al., 2019) failed to calculate for the following reasons:   Risk score cannot be calculated because patient has a medical history suggesting prior/existing ASCVD    Assessment & Plan:   Problem List Items Addressed This Visit       Cardiovascular and Mediastinum   Essential hypertension   BP looks great!      Relevant Orders   CMP14+EGFR   Vitamin D  (25 hydroxy)     Endocrine   Type 2 diabetes mellitus treated with insulin  (HCC) - Primary   Relevant Orders   POCT HgB A1C (Completed)   CMP14+EGFR   Vitamin D  (25  hydroxy)     Other   Vitamin D  deficiency   Due to recheck Vit D      Relevant Orders   CMP14+EGFR   Vitamin D  (25 hydroxy)   Tobacco abuse   Relevant Orders   Ambulatory Referral Lung Cancer Screening [facility]   GAD (generalized anxiety disorder)   Has use clonazepam  PRN in the past. Would like to have rx to use pren. Continue Effexor       Relevant Medications   clonazePAM  (KLONOPIN ) 0.25 MG disintegrating tablet   Other Visit Diagnoses       Encounter for screening mammogram for malignant neoplasm of breast       Relevant Orders   MM 3D SCREENING MAMMOGRAM BILATERAL BREAST     Screening for colon cancer       Relevant Orders   Cologuard     Diabetic eye exam [facility])        Relevant Orders   Ambulatory referral to Optometry      Assessment and Plan Assessment & Plan Type 2 diabetes mellitus Diabetes well-controlled with A1c of 5.7%. Mounjaro  7.5 mg effective; no dosage increase needed if insurance coverage continues. - Continue Mounjaro  7.5 mg.  Obesity Significant weight loss to 180s attributed to Mounjaro  and dietary changes. Focus on maintaining muscle mass and balanced diet. - Encourage healthy eating with focus on protein intake. - Advise incorporating variety of vegetables and fruits. - Consider multivitamin supplement.  Nausea secondary to Mounjaro  Nausea after Mounjaro  dose increase to 7.5 mg, managed with Zofran . More pronounced on first or second day post-injection. - Continue Zofran  as needed. - Advise consuming small amounts of protein during nausea.  Tobacco use disorder Smoker, not ready to quit but open to lung cancer screening. Low-dose CT scan preferred for detailed screening with less radiation. - Order low-dose CT scan for lung cancer screening.   Return in about 4 months (around 08/21/2024) for Diabetes follow-up.    Samantha Byars, MD

## 2024-04-22 LAB — CMP14+EGFR
ALT: 28 IU/L (ref 0–32)
AST: 32 IU/L (ref 0–40)
Albumin: 4.6 g/dL (ref 3.8–4.9)
Alkaline Phosphatase: 80 IU/L (ref 44–121)
BUN/Creatinine Ratio: 13 (ref 9–23)
BUN: 10 mg/dL (ref 6–24)
Bilirubin Total: 0.3 mg/dL (ref 0.0–1.2)
CO2: 23 mmol/L (ref 20–29)
Calcium: 9.5 mg/dL (ref 8.7–10.2)
Chloride: 98 mmol/L (ref 96–106)
Creatinine, Ser: 0.76 mg/dL (ref 0.57–1.00)
Globulin, Total: 2.4 g/dL (ref 1.5–4.5)
Glucose: 92 mg/dL (ref 70–99)
Potassium: 3.4 mmol/L — ABNORMAL LOW (ref 3.5–5.2)
Sodium: 138 mmol/L (ref 134–144)
Total Protein: 7 g/dL (ref 6.0–8.5)
eGFR: 92 mL/min/1.73 (ref >59)

## 2024-04-22 LAB — VITAMIN D 25 HYDROXY (VIT D DEFICIENCY, FRACTURES): Vit D, 25-Hydroxy: 37.5 ng/mL (ref 30.0–100.0)

## 2024-04-27 ENCOUNTER — Ambulatory Visit: Payer: Self-pay | Admitting: Family Medicine

## 2024-04-27 NOTE — Progress Notes (Signed)
 Hi Samantha [name], your potassium is still borderline low but a little better. Do you feel comfortable working on potassium rich diet?  Liver enzyme are normal.  Vit D looks better.

## 2024-05-06 ENCOUNTER — Other Ambulatory Visit: Payer: Self-pay | Admitting: Family Medicine

## 2024-05-06 DIAGNOSIS — K219 Gastro-esophageal reflux disease without esophagitis: Secondary | ICD-10-CM

## 2024-05-09 ENCOUNTER — Other Ambulatory Visit: Payer: Self-pay | Admitting: Family Medicine

## 2024-05-09 DIAGNOSIS — I10 Essential (primary) hypertension: Secondary | ICD-10-CM

## 2024-05-15 ENCOUNTER — Other Ambulatory Visit: Payer: Self-pay | Admitting: Family Medicine

## 2024-05-15 DIAGNOSIS — F339 Major depressive disorder, recurrent, unspecified: Secondary | ICD-10-CM

## 2024-06-16 ENCOUNTER — Encounter: Payer: Self-pay | Admitting: Family Medicine

## 2024-06-16 ENCOUNTER — Ambulatory Visit: Payer: Self-pay | Admitting: Family Medicine

## 2024-06-16 DIAGNOSIS — E119 Type 2 diabetes mellitus without complications: Secondary | ICD-10-CM

## 2024-06-16 NOTE — Telephone Encounter (Signed)
 Patient requesting mounjaro   with strength increase to 10mg   Last written as 7.5mg   03/23/2024 Last OV 04/21/2024 Upcoming appt 08/23/2024  Pended prescription

## 2024-06-16 NOTE — Telephone Encounter (Signed)
 FYI Only or Action Required?: Action required by provider: medication refill request and pt requesting increase in dosage to 10mg , if possible -- pt is completely out and is due for next dose this weekend.  Patient was last seen in primary care on 04/21/2024 by Alvan Dorothyann BIRCH, MD.  Called Nurse Triage reporting Medication Refill.  Triage Disposition: Call PCP Now  Patient/caregiver understands and will follow disposition?: Yes         Copied from CRM [id number]. Topic: General - Call Back - No Documentation >> Jun 16, 2024  1:21 PM Olam RAMAN wrote: Reason for CRM:  Pt is needing to speak to provider nurse, she is upset she doesn't have a refill for mounjoro and needs an increase as well Reason for Disposition  [1] Caller requests to speak to PCP now AND [2] won't give reason for call  (Exception: If 10 pm to 6 am, caller must first discuss reason for the call.)    Please see answer assessment for details.   Triager will forward encounter for Dr Alvan 's office to review and refill. Triager advised 3 business day turn around time. Patient verbalized understanding.  Answer Assessment - Initial Assessment Questions 1. REASON FOR CALL or QUESTION: What is your reason for calling today? or How can I best     Pt calling for refill on Mounjoro refilled. Pt would like to increase dose to 10mg , if possible. Pt endorsed that she has upcoming appt with PCP in Decem[place]. 2. CALLER: Document the source of call. (e.g., laboratory staff, caregiver or patient).     Patient.  Protocols used: PCP Call - No Triage-A-AH

## 2024-06-16 NOTE — Telephone Encounter (Unsigned)
 Copied from CRM #[id number]. Topic: Clinical - Medication Refill >> Jun 16, 2024  1:17 PM Olam RAMAN wrote: Medication: tirzepatide  (MOUNJARO ) 7.5 MG/0.5ML Pen   Has the patient contacted their pharmacy? Yes (Agent: If no, request that the patient contact the pharmacy for the refill. If patient does not wish to contact the pharmacy document the reason why and proceed with request.) (Agent: If yes, when and what did the pharmacy advise?)  This is the patient's preferred pharmacy:  [id number] [address], [place] - [address] [place] [address] Phone: [phone] Fax: [phone]  [facility] - [place], [place] - [address] [place] [address] Phone: [phone] Fax: [phone]  [facility] ([name]) [place], [place] - [address] [place] [address] Phone: [phone] Fax: [phone]  [facility] [address] [place] [address] Phone: [phone] Fax: [phone]  Is this the correct pharmacy for this prescription? Yes If no, delete pharmacy and type the correct one.   Has the prescription been filled recently? Yes  Is the patient out of the medication? No  Has the patient been seen for an appointment in the last year OR does the patient have an upcoming appointment? Yes  Can we respond through MyChart? Yes  Agent: Please be advised that Rx refills may take up to 3 business days. We ask that you follow-up with your pharmacy.

## 2024-06-17 MED ORDER — TIRZEPATIDE 10 MG/0.5ML ~~LOC~~ SOAJ: 10.0000 mg | SUBCUTANEOUS | 0 refills | Status: DC

## 2024-06-17 NOTE — Telephone Encounter (Signed)
 This was already sent.

## 2024-08-23 ENCOUNTER — Ambulatory Visit: Admitting: Family Medicine

## 2024-09-08 ENCOUNTER — Ambulatory Visit (INDEPENDENT_AMBULATORY_CARE_PROVIDER_SITE_OTHER): Admitting: Family Medicine

## 2024-09-08 ENCOUNTER — Encounter: Payer: Self-pay | Admitting: Family Medicine

## 2024-09-08 VITALS — BP 126/66 | HR 64 | Ht 62.0 in | Wt 158.0 lb

## 2024-09-08 DIAGNOSIS — E119 Type 2 diabetes mellitus without complications: Secondary | ICD-10-CM | POA: Diagnosis not present

## 2024-09-08 DIAGNOSIS — F411 Generalized anxiety disorder: Secondary | ICD-10-CM | POA: Diagnosis not present

## 2024-09-08 DIAGNOSIS — Z1211 Encounter for screening for malignant neoplasm of colon: Secondary | ICD-10-CM

## 2024-09-08 DIAGNOSIS — E559 Vitamin D deficiency, unspecified: Secondary | ICD-10-CM

## 2024-09-08 DIAGNOSIS — Z794 Long term (current) use of insulin: Secondary | ICD-10-CM

## 2024-09-08 DIAGNOSIS — E785 Hyperlipidemia, unspecified: Secondary | ICD-10-CM | POA: Diagnosis not present

## 2024-09-08 DIAGNOSIS — E1169 Type 2 diabetes mellitus with other specified complication: Secondary | ICD-10-CM

## 2024-09-08 DIAGNOSIS — I10 Essential (primary) hypertension: Secondary | ICD-10-CM | POA: Diagnosis not present

## 2024-09-08 DIAGNOSIS — Z1231 Encounter for screening mammogram for malignant neoplasm of breast: Secondary | ICD-10-CM | POA: Diagnosis not present

## 2024-09-08 LAB — POCT GLYCOSYLATED HEMOGLOBIN (HGB A1C): Hemoglobin A1C: 5 % (ref 4.0–5.6)

## 2024-09-08 LAB — POCT GLUCOSE (DEVICE FOR HOME USE): POC Glucose: 81 mg/dL (ref 70–99)

## 2024-09-08 MED ORDER — CLONAZEPAM 0.25 MG PO TBDP: 0.2500 mg | ORAL_TABLET | Freq: Every day | ORAL | 0 refills | Status: AC | PRN

## 2024-09-08 MED ORDER — TIRZEPATIDE 10 MG/0.5ML ~~LOC~~ SOAJ: 10.0000 mg | SUBCUTANEOUS | 0 refills | Status: AC

## 2024-09-08 NOTE — Assessment & Plan Note (Signed)
 Hypertension Well-controlled with current medication. No hypotension symptoms reported. - Monitor blood pressure at home or work, especially if experiencing lightheadedness or weakness. - Consider reducing amlodipine  if blood pressure readings are consistently below 115 mmHg.

## 2024-09-08 NOTE — Assessment & Plan Note (Addendum)
 Stable on Effexor . Denies wanting to make any changes. Uses Xanax PRN>

## 2024-09-08 NOTE — Progress Notes (Signed)
 "  Established Patient Office Visit  Patient ID: Samantha [name], female    DOB: 03/09/1966  Age: 59 y.o. MRN: [id number] PCP: Alvan Samantha JONETTA, MD  Chief Complaint  Patient presents with   Medical Management of Chronic Issues    Subjective:     HPI  Discussed the use of AI scribe software for clinical note transcription with the patient, who gave verbal consent to proceed.  History of Present Illness Samantha [name] is a 59 year old female with diabetes who presents with fatigue and significant weight loss.  Unintentional weight loss - Weight loss of nearly 100 pounds over an unspecified period - Current weight is 157 pounds on clinic scale; home scale shows goal weight of 155 pounds - Weight loss attributed to inadequate caloric intake due to busy work schedule, frequently skipping breakfast and consuming only one meal per day - Loose skin and muscle mass loss present, causing concern - Coworkers have commented on her appearance, suggesting excessive weight loss  Fatigue - Persistent fatigue attributed to inadequate caloric intake and significant weight loss  Diabetes mellitus management - Currently taking Mounjaro  weekly for diabetes management - Experienced nausea and vomiting with dose increase to 10 mg, but side effects have improved - Diabetes is well controlled on current regimen - Out of Mounjaro  and will miss a dose this Sunday; prescription refill is pending at [facility]  Musculoskeletal symptoms - Back pain improved with weight loss  Antidepressant therapy - Taking Effexor  for mood stabilization - Prefers not to adjust Effexor  dosage as she feels stable  Hypertension management - Taking amlodipine  for blood pressure control - Not monitoring blood pressure regularly at work  [employer] never got her [employer], would like us  to place a new order.     ROS    Objective:     BP 126/66   Pulse 64   Ht 5' 2 (1.575 m)   Wt 158 lb (71.7 kg)   LMP   (LMP Unknown)   SpO2 99%   BMI 28.90 kg/m    Physical Exam Vitals and nursing note reviewed.  Constitutional:      Appearance: Normal appearance.  HENT:     Head: Normocephalic and atraumatic.  Eyes:     Conjunctiva/sclera: Conjunctivae normal.  Cardiovascular:     Rate and Rhythm: Normal rate and regular rhythm.  Pulmonary:     Effort: Pulmonary effort is normal.     Breath sounds: Normal breath sounds.  Skin:    General: Skin is warm and dry.  Neurological:     Mental Status: She is alert.  Psychiatric:        Mood and Affect: Mood normal.      Results for orders placed or performed in visit on 09/08/24  POCT HgB A1C  Result Value Ref Range   Hemoglobin A1C 5.0 4.0 - 5.6 %   HbA1c POC (<> result, manual entry)     HbA1c, POC (prediabetic range)     HbA1c, POC (controlled diabetic range)    POCT Glucose (Device for Home Use)  Result Value Ref Range   Glucose Fasting, POC     POC Glucose 81 70 - 99 mg/dl      The ASCVD Risk score (Arnett DK, et al., 2019) failed to calculate for the following reasons:   Risk score cannot be calculated because patient has a medical history suggesting prior/existing ASCVD   * - Cholesterol units were assumed    Assessment &  Plan:   Problem List Items Addressed This Visit       Cardiovascular and Mediastinum   Essential hypertension   Hypertension Well-controlled with current medication. No hypotension symptoms reported. - Monitor blood pressure at home or work, especially if experiencing lightheadedness or weakness. - Consider reducing amlodipine  if blood pressure readings are consistently below 115 mmHg.      Relevant Orders   VITAMIN D  25 Hydroxy (Vit-D Deficiency, Fractures)   CMP14+EGFR   Lipid panel   CBC     Endocrine   Type 2 diabetes mellitus with hyperlipidemia (HCC) - Primary   Well-controlled with Mounjaro  10 mg. Weight loss improved glycemic control but raised concerns about excessive weight and muscle  mass loss. Nausea and vomiting from 10 mg dose improved. She is hesitant to reduce dose due to fear of weight gain. - Continue Mounjaro  10 mg for now. - Monitor weight and muscle mass. - Consider reducing Mounjaro  to 7.5 mg if weight loss continues or muscle mass loss is significant. - Encouraged increased protein intake to prevent muscle mass loss.in AM since reports she only eats once a day       Relevant Medications   tirzepatide  (MOUNJARO ) 10 MG/0.5ML Pen     Other   Vitamin D  deficiency   Relevant Orders   VITAMIN D  25 Hydroxy (Vit-D Deficiency, Fractures)   CMP14+EGFR   Lipid panel   CBC   GAD (generalized anxiety disorder)   Stable on Effexor . Denies wanting to make any changes. Uses Xanax PRN>       Relevant Medications   clonazePAM  (KLONOPIN ) 0.25 MG disintegrating tablet   Other Visit Diagnoses       Screening mammogram for breast cancer       Relevant Orders   MM 3D SCREENING MAMMOGRAM BILATERAL BREAST     Screen for colon cancer       Relevant Orders   Cologuard       Assessment and Plan Assessment & Plan  Abnormal weight loss Significant weight loss achieved, current weight 155 lbs. Concerns about excessive weight and muscle mass loss. Reached weight goal, advised to monitor for further loss. - Monitor weight and muscle mass. - Encouraged increased caloric and protein intake, including protein drinks or bars in the morning. - Will consider reducing Mounjaro  dose if weight loss continues beyond desired levels.  General Health Maintenance Due for mammogram and Cologuard test. - Ordered mammogram. - Ordered Cologuard test.   Return in about 4 months (around 01/06/2025) for Diabetes follow-up.    Samantha Byars, MD [facility] at [facility]   "

## 2024-09-08 NOTE — Assessment & Plan Note (Signed)
 Well-controlled with Mounjaro  10 mg. Weight loss improved glycemic control but raised concerns about excessive weight and muscle mass loss. Nausea and vomiting from 10 mg dose improved. She is hesitant to reduce dose due to fear of weight gain. - Continue Mounjaro  10 mg for now. - Monitor weight and muscle mass. - Consider reducing Mounjaro  to 7.5 mg if weight loss continues or muscle mass loss is significant. - Encouraged increased protein intake to prevent muscle mass loss.in AM since reports she only eats once a day

## 2024-09-09 ENCOUNTER — Ambulatory Visit: Payer: Self-pay | Admitting: Family Medicine

## 2024-09-09 LAB — CMP14+EGFR
ALT: 16 IU/L (ref 0–32)
AST: 22 IU/L (ref 0–40)
Albumin: 4.3 g/dL (ref 3.8–4.9)
Alkaline Phosphatase: 68 IU/L (ref 49–135)
BUN/Creatinine Ratio: 19 (ref 9–23)
BUN: 10 mg/dL (ref 6–24)
Bilirubin Total: 0.2 mg/dL (ref 0.0–1.2)
CO2: 24 mmol/L (ref 20–29)
Calcium: 9 mg/dL (ref 8.7–10.2)
Chloride: 102 mmol/L (ref 96–106)
Creatinine, Ser: 0.54 mg/dL — ABNORMAL LOW (ref 0.57–1.00)
Globulin, Total: 2.2 g/dL (ref 1.5–4.5)
Glucose: 65 mg/dL — ABNORMAL LOW (ref 70–99)
Potassium: 4 mmol/L (ref 3.5–5.2)
Sodium: 141 mmol/L (ref 134–144)
Total Protein: 6.5 g/dL (ref 6.0–8.5)
eGFR: 107 mL/min/1.73

## 2024-09-09 LAB — LIPID PANEL
Chol/HDL Ratio: 4.2 ratio (ref 0.0–4.4)
Cholesterol, Total: 142 mg/dL (ref 100–199)
HDL: 34 mg/dL — ABNORMAL LOW
LDL Chol Calc (NIH): 94 mg/dL (ref 0–99)
Triglycerides: 72 mg/dL (ref 0–149)
VLDL Cholesterol Cal: 14 mg/dL (ref 5–40)

## 2024-09-09 LAB — CBC
Hematocrit: 42 % (ref 34.0–46.6)
Hemoglobin: 14.1 g/dL (ref 11.1–15.9)
MCH: 31.6 pg (ref 26.6–33.0)
MCHC: 33.6 g/dL (ref 31.5–35.7)
MCV: 94 fL (ref 79–97)
Platelets: 104 x10E3/uL — ABNORMAL LOW (ref 150–450)
RBC: 4.46 x10E6/uL (ref 3.77–5.28)
RDW: 12.7 % (ref 11.7–15.4)
WBC: 5 x10E3/uL (ref 3.4–10.8)

## 2024-09-09 LAB — MICROALBUMIN / CREATININE URINE RATIO
Creatinine, Urine: 207.8 mg/dL
Microalb/Creat Ratio: 11 mg/g{creat} (ref 0–29)
Microalbumin, Urine: 22.7 ug/mL

## 2024-09-09 LAB — VITAMIN D 25 HYDROXY (VIT D DEFICIENCY, FRACTURES): Vit D, 25-Hydroxy: 51.4 ng/mL (ref 30.0–100.0)

## 2024-09-09 NOTE — Progress Notes (Signed)
 Hi Samantha [name], kidney function is stable liver enzymes look good.  LDL cholesterol is under 100 which is great.  Platelets are still low but fairly stable.  Just make sure you are getting enough protein in your diet.  Trying to eat protein at least 3 times a day.  Vitamin D  also looks good.  No excess protein in the urine which is great.

## 2024-09-14 ENCOUNTER — Other Ambulatory Visit: Payer: Self-pay | Admitting: Family Medicine

## 2024-09-14 DIAGNOSIS — E559 Vitamin D deficiency, unspecified: Secondary | ICD-10-CM

## 2024-09-19 ENCOUNTER — Encounter: Payer: Self-pay | Admitting: Family Medicine

## 2024-09-19 ENCOUNTER — Emergency Department (HOSPITAL_COMMUNITY)

## 2024-09-19 ENCOUNTER — Other Ambulatory Visit: Payer: Self-pay

## 2024-09-19 ENCOUNTER — Ambulatory Visit: Payer: Self-pay

## 2024-09-19 ENCOUNTER — Ambulatory Visit: Admitting: Family Medicine

## 2024-09-19 ENCOUNTER — Emergency Department (HOSPITAL_COMMUNITY)
Admission: EM | Admit: 2024-09-19 | Discharge: 2024-09-19 | Disposition: A | Discharge status: Home / Self Care | Attending: Emergency Medicine | Admitting: Emergency Medicine

## 2024-09-19 ENCOUNTER — Encounter (HOSPITAL_COMMUNITY): Payer: Self-pay

## 2024-09-19 VITALS — BP 148/78 | HR 67 | Ht 62.0 in | Wt 161.0 lb

## 2024-09-19 DIAGNOSIS — F411 Generalized anxiety disorder: Secondary | ICD-10-CM | POA: Diagnosis not present

## 2024-09-19 DIAGNOSIS — R569 Unspecified convulsions: Secondary | ICD-10-CM | POA: Insufficient documentation

## 2024-09-19 DIAGNOSIS — E119 Type 2 diabetes mellitus without complications: Secondary | ICD-10-CM | POA: Insufficient documentation

## 2024-09-19 DIAGNOSIS — I10 Essential (primary) hypertension: Secondary | ICD-10-CM | POA: Insufficient documentation

## 2024-09-19 DIAGNOSIS — G40A09 Absence epileptic syndrome, not intractable, without status epilepticus: Secondary | ICD-10-CM

## 2024-09-19 DIAGNOSIS — Z79899 Other long term (current) drug therapy: Secondary | ICD-10-CM | POA: Insufficient documentation

## 2024-09-19 DIAGNOSIS — I69398 Other sequelae of cerebral infarction: Secondary | ICD-10-CM

## 2024-09-19 DIAGNOSIS — G4489 Other headache syndrome: Secondary | ICD-10-CM | POA: Diagnosis not present

## 2024-09-19 DIAGNOSIS — R519 Headache, unspecified: Secondary | ICD-10-CM | POA: Diagnosis not present

## 2024-09-19 DIAGNOSIS — R7989 Other specified abnormal findings of blood chemistry: Secondary | ICD-10-CM | POA: Diagnosis not present

## 2024-09-19 DIAGNOSIS — R110 Nausea: Secondary | ICD-10-CM | POA: Insufficient documentation

## 2024-09-19 DIAGNOSIS — F339 Major depressive disorder, recurrent, unspecified: Secondary | ICD-10-CM | POA: Diagnosis not present

## 2024-09-19 LAB — BASIC METABOLIC PANEL WITH GFR
Anion gap: 12 (ref 5–15)
BUN: 9 mg/dL (ref 6–20)
CO2: 25 mmol/L (ref 22–32)
Calcium: 8.6 mg/dL — ABNORMAL LOW (ref 8.9–10.3)
Chloride: 102 mmol/L (ref 98–111)
Creatinine, Ser: 0.49 mg/dL (ref 0.44–1.00)
GFR, Estimated: >60 mL/min
Glucose, Bld: 102 mg/dL — ABNORMAL HIGH (ref 70–99)
Potassium: 3.8 mmol/L (ref 3.5–5.1)
Sodium: 139 mmol/L (ref 135–145)

## 2024-09-19 LAB — CBC WITH DIFFERENTIAL/PLATELET
Abs Immature Granulocytes: 0.01 K/uL (ref 0.00–0.07)
Basophils Absolute: 0 K/uL (ref 0.0–0.1)
Basophils Relative: 0 %
Eosinophils Absolute: 0 K/uL (ref 0.0–0.5)
Eosinophils Relative: 1 %
HCT: 42.3 % (ref 36.0–46.0)
Hemoglobin: 14.6 g/dL (ref 12.0–15.0)
Immature Granulocytes: 0 %
Lymphocytes Relative: 18 %
Lymphs Abs: 1.1 K/uL (ref 0.7–4.0)
MCH: 31.9 pg (ref 26.0–34.0)
MCHC: 34.5 g/dL (ref 30.0–36.0)
MCV: 92.6 fL (ref 80.0–100.0)
Monocytes Absolute: 0.2 K/uL (ref 0.1–1.0)
Monocytes Relative: 4 %
Neutro Abs: 4.6 K/uL (ref 1.7–7.7)
Neutrophils Relative %: 77 %
Platelets: 98 K/uL — ABNORMAL LOW (ref 150–400)
RBC: 4.57 MIL/uL (ref 3.87–5.11)
RDW: 12.9 % (ref 11.5–15.5)
WBC: 6 K/uL (ref 4.0–10.5)
nRBC: 0 % (ref 0.0–0.2)

## 2024-09-19 LAB — TROPONIN T, HIGH SENSITIVITY
Troponin T High Sensitivity: 16 ng/L (ref 0–19)
Troponin T High Sensitivity: 20 ng/L — ABNORMAL HIGH (ref 0–19)

## 2024-09-19 MED ORDER — DIPHENHYDRAMINE HCL 50 MG/ML IJ SOLN
25.0000 mg | Freq: Once | INTRAMUSCULAR | Status: AC
  Administered 2024-09-19: 25 mg via INTRAVENOUS
  Filled 2024-09-19: qty 1

## 2024-09-19 MED ORDER — IOHEXOL 350 MG/ML SOLN
75.0000 mL | Freq: Once | INTRAVENOUS | Status: AC | PRN
  Administered 2024-09-19: 75 mL via INTRAVENOUS

## 2024-09-19 MED ORDER — VENLAFAXINE HCL ER 37.5 MG PO CP24: 37.5000 mg | ORAL_CAPSULE | Freq: Every day | ORAL | 1 refills | Status: DC

## 2024-09-19 MED ORDER — VENLAFAXINE HCL ER 150 MG PO CP24: 150.0000 mg | ORAL_CAPSULE | Freq: Every day | ORAL | 1 refills | Status: DC

## 2024-09-19 MED ORDER — PROCHLORPERAZINE EDISYLATE 10 MG/2ML IJ SOLN
10.0000 mg | Freq: Once | INTRAMUSCULAR | Status: AC
  Administered 2024-09-19: 10 mg via INTRAVENOUS
  Filled 2024-09-19: qty 2

## 2024-09-19 MED ORDER — ACETAMINOPHEN 500 MG PO TABS
1000.0000 mg | ORAL_TABLET | Freq: Once | ORAL | Status: AC
  Administered 2024-09-19: 1000 mg via ORAL
  Filled 2024-09-19: qty 2

## 2024-09-19 MED ORDER — ONDANSETRON HCL 4 MG/2ML IJ SOLN
4.0000 mg | Freq: Once | INTRAMUSCULAR | Status: AC
  Administered 2024-09-19: 4 mg via INTRAVENOUS
  Filled 2024-09-19: qty 2

## 2024-09-19 MED ORDER — LEVETIRACETAM 500 MG PO TABS: 500.0000 mg | ORAL_TABLET | Freq: Two times a day (BID) | ORAL | 0 refills | Status: AC

## 2024-09-19 MED ORDER — MORPHINE SULFATE (PF) 4 MG/ML IV SOLN: 4.0000 mg | Freq: Once | INTRAVENOUS | Status: DC

## 2024-09-19 MED ORDER — LEVETIRACETAM (KEPPRA) 500 MG/5 ML ADULT IV PUSH
60.0000 mg/kg | Freq: Once | INTRAVENOUS | Status: AC
  Administered 2024-09-19: 4500 mg via INTRAVENOUS
  Filled 2024-09-19: qty 45

## 2024-09-19 NOTE — Progress Notes (Signed)
 "  Acute Office Visit  Patient ID: Samantha [name], female    DOB: 11-May-1966, 59 y.o.   MRN: [id number]  PCP: Alvan Dorothyann JONETTA, MD  Chief Complaint  Patient presents with   Seizures    Subjective:     HPI  Discussed the use of AI scribe software for clinical note transcription with the patient, who gave verbal consent to proceed.  History of Present Illness Samantha [name] is a 59 year old female with a history of seizures post-stroke who presents with recurrent seizure episodes. She is accompanied by her sister for support.  Seizure activity - Recurrent episodes characterized by staring spells, unresponsiveness, and hand posturing (hands drawing up) - witness by her sister Luke who is here today with her.   - Most recent episode lasted approximately 11 minutes, longer than previous episodes which typically lasted 1-2 minutes. Sister had witnessed 2 others months ago.  - Memory lapses during episodes, including inability to recall events leading up to or during seizures - No recent head trauma or falls - Occasional prodromal sensation described as feeling 'funny' while driving, prompting her to pull over  Postictal symptoms - Severe headaches following seizure episodes, described as 'busting' - Associated nausea and vomiting, with vomiting occurring en route to the appointment - Movement exacerbates nausea  Seizure history and management - Seizures began following a stroke - Previously treated with Keppra , discontinued in 2021 after a period without seizures - Official diagnosis of seizures (Grand mal) made in [place]  after her stroke - No neurologist follow-up since moving from [place]  to [place]  - Last brain scan performed during stroke period   ROS     Objective:    BP (!) 148/78   Pulse 67   Ht 5' 2 (1.575 m)   Wt 161 lb (73 kg)   LMP  (LMP Unknown)   SpO2 99%   BMI 29.45 kg/m    Physical Exam Vitals reviewed.  Constitutional:       Appearance: Normal appearance.  HENT:     Head: Normocephalic.  Pulmonary:     Effort: Pulmonary effort is normal.  Neurological:     Mental Status: She is alert and oriented to person, place, and time.  Psychiatric:        Mood and Affect: Mood normal.        Behavior: Behavior normal.       No results found for any visits on 09/19/24.     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)     Other   GAD (generalized anxiety disorder)   Relevant Medications   venlafaxine  XR (EFFEXOR  XR) 37.5 MG 24 hr capsule   venlafaxine  XR (EFFEXOR -XR) 150 MG 24 hr capsule   Depression, recurrent   Relevant Medications   venlafaxine  XR (EFFEXOR  XR) 37.5 MG 24 hr capsule   venlafaxine  XR (EFFEXOR -XR) 150 MG 24 hr capsule   Other Visit Diagnoses       Postictal headache    -  Primary   Relevant Medications   venlafaxine  XR (EFFEXOR  XR) 37.5 MG 24 hr capsule   venlafaxine  XR (EFFEXOR -XR) 150 MG 24 hr capsule     Absence seizure (HCC)           Assessment and Plan Assessment & Plan Absence epileptic syndrome Absence seizure with prolonged duration. Immediate evaluation  needed due to safety concerns, especially regarding driving. - Needs  blood work to check electrolytes and other potential causes of seizures. - Referred to neurologist for EEG and further evaluation. - Advised against driving until cleared by neurologist. - Will provided work note for remote work arrangement. - EMS called to transport patient to the hospital.    Postictal headache Severe headache following recent seizure, likely related to seizure activity. - Advised rest and hydration. - vomited once.     Meds ordered this encounter  Medications   venlafaxine  XR (EFFEXOR  XR) 37.5 MG 24 hr capsule    Sig: Take 1 capsule (37.5 mg total) by mouth daily with breakfast.    Dispense:  90 capsule     Refill:  1   venlafaxine  XR (EFFEXOR -XR) 150 MG 24 hr capsule    Sig: Take 1 capsule (150 mg total) by mouth daily with breakfast.    Dispense:  90 capsule    Refill:  1    No follow-ups on file.  Dorothyann Byars, MD [facility] at [facility]   "

## 2024-09-19 NOTE — Telephone Encounter (Signed)
 FYI Only or Action Required?: FYI only for provider: appointment scheduled on 1/19.  Patient was last seen in primary care on 09/08/2024 by Alvan Dorothyann BIRCH, MD.  Called Nurse Triage reporting Seizures.  Symptoms began today.  Interventions attempted: Nothing.  Symptoms are: stable.  Triage Disposition: See Physician Within 24 Hours  Patient/caregiver understands and will follow disposition?: Yes            Message from [facility] V sent at 09/19/2024 10:55 AM EST  Reason for Triage: Patient had a seizure at work. She was assisted into a chair by coworkers.   Reason for Disposition  [1] Seizure lasting unknown duration AND [2] history of prior seizures AND [3] taking antiseizure medicine (anticonvulsant)  Answer Assessment - Initial Assessment Questions 1. ONSET: When did the seizure occur?     Today while at work   2. DURATION: How long did the seizure last (or how long has it been happening)? (e.g., seconds, minutes)  Note: Most seizures last less than 5 minutes.     She is unsure   3. DESCRIPTION: Describe what happened during the seizure. Did the body become stiff? Was there any jerking?  Did they lose consciousness during the seizure?     She was walking down the hall, the went blank. She did not lose consciousness.   4. CIRCUMSTANCE: What was the person doing when the seizure began?      Walking down the hall at work  5. MENTAL STATUS AFTER SEIZURE: Does the person seem more groggy or sleepy? Does the person know who they are, who you are, and where they are now?      And O x 3 during triage call.   6. PRIOR SEIZURES: Has the person had a seizure (convulsion) before? (e.g., epilepsy, other cause)  If Yes, ask: When was the last time? and What happened last time?       Yes last seizure was around 2015, she is unsure; sister said it was a month ago.   7. EPILEPSY: Does the person have epilepsy? Note: Check for medical ID bracelet.      No   8. MEDICINES: Does the person take anticonvulsant medications? (e.g., Yes, No; missed doses, any recent changes)     None   9. INJURY: Was the person hurt or injured during the seizure? (e.g., hit their head, bit their tongue)     No   10. OTHER SYMPTOMS: Are there any other symptoms? (e.g., fever, headache)       Headache noted.      Patient called in to triage with complaints of a seizure today while at work. The patient stated she was on Keppra  in 2015; she is no longer on any anti-convulsant medications. She reports not having a seizure in years.    Appointment scheduled for further evaluation; Patient agrees with the plan of care, and will reach out if symptoms worsen or persist.  Protocols used: [facility]

## 2024-09-19 NOTE — Discharge Instructions (Signed)
 It was a pleasure taking care of you today. You were seen in the Emergency Department for evaluation of multiple seizures. Your work-up was reassuring. Your CT/Xray/Labs showed no acute abnormalities that would explain your symptoms.  I do suspect the reason you likely had a seizure is because you have been off of your Keppra  for several years.  Therefore, I have restarted your Keppra  prescription.  I have written the directions on the prescription bottle for how to taper up to the maximum dose.  Please follow them as they are written.  I have also sent in a referral to get established with neurology for continued management.  You will need to call them to schedule an appointment. Refer to the attached documentation for further management of your symptoms.  Additionally, your troponin, which is a protein in your heart, was mildly elevated today.  You are not currently experiencing any chest pain or shortness of breath, however I do think you would benefit from following up with a cardiologist.  I have placed an ambulatory referral, which means they should call you to schedule an appointment in 1-2 business days.  If you do not hear from them by Wednesday, you may call to schedule an appointment.  Their contact information is also provided on your discharge paperwork.    Please return to the ER if you experience chest pain, trouble breathing, intractable nausea/vomiting or any other life threatening illnesses.

## 2024-09-19 NOTE — ED Notes (Signed)
 Patient given discharge, follow-up, and medication instructions. Patient verbalized understanding. Patient left with family via private vehicle.

## 2024-09-19 NOTE — Progress Notes (Signed)
 Pt is here with her sister.   Her sister informed me that this has been the 2nd seizure that she has had. The 1st happened while she was at work and the 2nd happened after her sister picked her up. She said that the seizure at her home that lasted for 11 mins.   Pt has no recall of what happened anytime she has had a seizure.  Pt has not taken any medication for seizures since June 2021 she stated that she hasn't had any seizures so that is why she stopped taking the medication. (Levetiracetam  500 mg BID). And she also hasn't seen a neurologist

## 2024-09-19 NOTE — ED Triage Notes (Signed)
 Pt BIB [facility] from [employer] office for seizures. This AM, began having a HA, then had 1 seizure at work of unknown length. When sister came to pick pt up, pt had 1 seizure that lasted 10 minutes. Per sister, seizures consisted of zoning out and contracted arms. Sister took pt to primary care doctor where she had another seizure that lasted 1 minute. Pt has hx of seizures from stroke in 2020, stopped taking her Keppra  in 2021. All VSS per EMS.

## 2024-09-21 ENCOUNTER — Encounter: Payer: Self-pay | Admitting: Family Medicine

## 2024-09-21 DIAGNOSIS — F411 Generalized anxiety disorder: Secondary | ICD-10-CM

## 2024-09-21 DIAGNOSIS — F339 Major depressive disorder, recurrent, unspecified: Secondary | ICD-10-CM

## 2024-09-22 MED ORDER — VENLAFAXINE HCL ER 150 MG PO CP24: 150.0000 mg | ORAL_CAPSULE | Freq: Every day | ORAL | 1 refills | Status: AC

## 2024-09-22 MED ORDER — VENLAFAXINE HCL ER 37.5 MG PO CP24: 37.5000 mg | ORAL_CAPSULE | Freq: Every day | ORAL | 1 refills | Status: AC

## 2024-10-05 ENCOUNTER — Encounter: Payer: Self-pay | Admitting: Family Medicine

## 2024-10-05 ENCOUNTER — Telehealth: Payer: Self-pay

## 2024-10-05 ENCOUNTER — Encounter: Payer: Self-pay | Admitting: Neurology

## 2024-10-05 ENCOUNTER — Ambulatory Visit: Admitting: Family Medicine

## 2024-10-05 VITALS — BP 142/78 | HR 60 | Ht 62.0 in | Wt 153.1 lb

## 2024-10-05 DIAGNOSIS — E1169 Type 2 diabetes mellitus with other specified complication: Secondary | ICD-10-CM

## 2024-10-05 DIAGNOSIS — R569 Unspecified convulsions: Secondary | ICD-10-CM

## 2024-10-05 DIAGNOSIS — Z79899 Other long term (current) drug therapy: Secondary | ICD-10-CM

## 2024-10-05 DIAGNOSIS — E785 Hyperlipidemia, unspecified: Secondary | ICD-10-CM | POA: Diagnosis not present

## 2024-10-05 DIAGNOSIS — I10 Essential (primary) hypertension: Secondary | ICD-10-CM

## 2024-10-05 DIAGNOSIS — Z8673 Personal history of transient ischemic attack (TIA), and cerebral infarction without residual deficits: Secondary | ICD-10-CM

## 2024-10-05 DIAGNOSIS — Z7985 Long-term (current) use of injectable non-insulin antidiabetic drugs: Secondary | ICD-10-CM | POA: Diagnosis not present

## 2024-10-05 DIAGNOSIS — Z1211 Encounter for screening for malignant neoplasm of colon: Secondary | ICD-10-CM | POA: Diagnosis not present

## 2024-10-05 DIAGNOSIS — I69398 Other sequelae of cerebral infarction: Secondary | ICD-10-CM | POA: Diagnosis not present

## 2024-10-05 MED ORDER — AMLODIPINE BESYLATE 10 MG PO TABS: 10.0000 mg | ORAL_TABLET | Freq: Every day | ORAL | 1 refills | Status: AC

## 2024-10-05 NOTE — Progress Notes (Signed)
 "  Established Patient Office Visit  Patient ID: Samantha [name], female    DOB: [birth year]  Age: 59 y.o. MRN: [id number] PCP: Samantha Samantha JONETTA, MD  Chief Complaint  Patient presents with   Hospitalization Follow-up    Pt is here for ED f/u.    Subjective:     HPI  Discussed the use of AI scribe software for clinical note transcription with the patient, who gave verbal consent to proceed.  History of Present Illness Samantha [name] is a 59 year old female with a history of stroke and seizures who presents with recent episodes of memory loss and staring spells.  Episodes of memory loss and staring spells - Multiple recent episodes characterized by unresponsiveness and a blank stare, sometimes with hand movements - Episodes have occurred in various settings, including at a friend's house and while dining out - No recollection of visiting the doctor's office or being picked up by ambulance during these episodes - No convulsions or shaking during these events, differing from prior seizure activity - Incident of waking up in the guest bedroom with no memory of how she got there, suggesting possible nocturnal seizure  Seizure history - History of stroke in 2017, 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 - Previously treated and remained seizure-free and off medication for six years until recent episodes - Current episodes differ from prior seizures, as they lack convulsions or shaking  Antiepileptic medication use - Currently taking Keppra , initially once daily, now increased to twice daily - Uncertain of exact quantity of medication remaining, but a significant amount is left  Psychosocial stressors - Elevated stress levels at work due to high-pressure environment and staffing shortages - Increased responsibilities and stress at work     ROS    Objective:     BP (!) 142/78   Pulse 60   Ht 5' 2 (1.575 m)   Wt 153 lb 1.9 oz (69.5 kg)   LMP  (LMP Unknown)   SpO2 100%    BMI 28.01 kg/m    Physical Exam Vitals reviewed.  Constitutional:      Appearance: Normal appearance.  HENT:     Head: Normocephalic.  Pulmonary:     Effort: Pulmonary effort is normal.  Neurological:     Mental Status: She is alert and oriented to person, place, and time.  Psychiatric:        Mood and Affect: Mood normal.        Behavior: Behavior normal.      No results found for any visits on 10/05/24.    The ASCVD Risk score (Arnett DK, et al., 2019) failed to calculate for the following reasons:   Risk score cannot be calculated because patient has a medical history suggesting prior/existing ASCVD   * - Cholesterol units were assumed    Assessment & Plan:   Problem List Items Addressed This Visit       Cardiovascular and Mediastinum   Essential hypertension   BP a little elevated but says too her BP right before she fot here.   BP Readings from Last 3 Encounters:  10/05/24 (!) 142/78  09/19/24 127/65  09/19/24 (!) 148/78           Endocrine   Type 2 diabetes mellitus with hyperlipidemia (HCC)    Type 2 diabetes mellitus with hyperlipidemia A1c was 5.0 in January, indicating good glycemic control.      Relevant Orders   CBC with Differential/Platelet   CMP14+EGFR  Nervous and Auditory   Seizure as late effect of cerebrovascular accident (CVA) (HCC) - Primary   - continue Keppra  at current dose. Just inc to BID last Thursday.  - will place Referral to Neurology - Reminded her not to drive - will check labs since start of Keppra   - Will complete FMLA paperwork.  Staring date 1/27 through 3/2       Relevant Orders   Ambulatory referral to Neurology     Other   History of CVA (cerebrovascular accident)   Intolerant to Atorvastatin . Would benefit from statin.       Other Visit Diagnoses       Medication management       Relevant Orders   CBC with Differential/Platelet   CMP14+EGFR     Screen for colon cancer       Relevant  Orders   Cologuard       Assessment and Plan Assessment & Plan  Essential hypertension Blood pressure elevated at initial visit. - Rechecked blood pressure before leaving the clinic.  General Health Maintenance Encouraged to complete overdue screenings and health maintenance tasks. - Schedule mammogram. - Consider Cologuard for colorectal cancer screening.    Return in about 1 month (around 11/02/2024) for FMLA.    Samantha Byars, MD [facility] Medicine at [facility]   "

## 2024-10-05 NOTE — Assessment & Plan Note (Signed)
 BP a little elevated but says too her BP right before she fot here.   BP Readings from Last 3 Encounters:  10/05/24 (!) 142/78  09/19/24 127/65  09/19/24 (!) 148/78

## 2024-10-05 NOTE — Telephone Encounter (Signed)
 Copied from CRM [id number]. Topic: Clinical - Medication Refill >> Oct 05, 2024  3:45 PM Everette C wrote: Medication: amLODipine  (NORVASC ) 10 MG tablet [[id number]]  venlafaxine  XR (EFFEXOR -XR) 150 MG 24 hr capsule [[id number]]  Has the patient contacted their pharmacy? Yes (Agent: If no, request that the patient contact the pharmacy for the refill. If patient does not wish to contact the pharmacy document the reason why and proceed with request.) (Agent: If yes, when and what did the pharmacy advise?)  This is the patient's preferred pharmacy:  [facility] [phone] [address], [place] - [address] [place] [address] Phone: [phone] Fax: [phone]  Is this the correct pharmacy for this prescription? Yes If no, delete pharmacy and type the correct one.   Has the prescription been filled recently? Yes  Is the patient out of the medication? Yes  Has the patient been seen for an appointment in the last year OR does the patient have an upcoming appointment? Yes  Can we respond through MyChart? No  Agent: Please be advised that Rx refills may take up to 3 business days. We ask that you follow-up with your pharmacy.

## 2024-10-05 NOTE — Assessment & Plan Note (Deleted)
 Seizure as late effect of cerebrovascular accident (CVA) Recent increase in seizure frequency with episodes suggestive of absence seizures. Differential includes structural brain changes or stress-related exacerbation. Stress identified as a potential contributing factor. - Ordered blood work to assess blood cell count and kidney function due to Keppra  use. - Referred to neurology for further evaluation and management. - Advised against driving until cleared by neurology. - Continue Keppra  twice daily. - Complete FMLA paperwork for leave until end of February, with potential extension based on specialist recommendations.

## 2024-10-05 NOTE — Assessment & Plan Note (Signed)
 Intolerant to Atorvastatin . Would benefit from statin.

## 2024-10-05 NOTE — Progress Notes (Signed)
 Pt is here for ED f/u. When she was seen at our office on 1/19 she was sent to the ED for Seizures.   Pt was restarted on Keppra  500 mg BID. She stated that she is to call to schedule an appt with Cardiology but hasn't heard from Neurology. Also when I went to get patient from the lobby she was sitting alone I asked her if someone had driven her here she stated that she had drove herself here. She also told me that she had an episode last wk of where she woke up in her guest bedroom. She said that she had gotten a call from her employer and went into the kitchen to get some water and woke up in her guest bedroom and doesn't remember how she got there.

## 2024-10-05 NOTE — Assessment & Plan Note (Signed)
" °  Type 2 diabetes mellitus with hyperlipidemia A1c was 5.0 in January, indicating good glycemic control. "

## 2024-10-05 NOTE — Assessment & Plan Note (Signed)
-   continue Keppra  at current dose. Just inc to BID last Thursday.  - will place Referral to Neurology - Reminded her not to drive - will check labs since start of Keppra   - Will complete FMLA paperwork.  Staring date 1/27 through 3/2

## 2024-10-06 ENCOUNTER — Ambulatory Visit: Payer: Self-pay | Admitting: Family Medicine

## 2024-10-06 ENCOUNTER — Other Ambulatory Visit: Payer: Self-pay | Admitting: Family Medicine

## 2024-10-06 DIAGNOSIS — I10 Essential (primary) hypertension: Secondary | ICD-10-CM

## 2024-10-06 LAB — CBC WITH DIFFERENTIAL/PLATELET
Basophils Absolute: 0.1 10*3/uL (ref 0.0–0.2)
Basos: 1 %
EOS (ABSOLUTE): 0.1 10*3/uL (ref 0.0–0.4)
Eos: 1 %
Hematocrit: 45.9 % (ref 34.0–46.6)
Hemoglobin: 15.6 g/dL (ref 11.1–15.9)
Immature Grans (Abs): 0 10*3/uL (ref 0.0–0.1)
Immature Granulocytes: 0 %
Lymphocytes Absolute: 2.4 10*3/uL (ref 0.7–3.1)
Lymphs: 35 %
MCH: 31.4 pg (ref 26.6–33.0)
MCHC: 34 g/dL (ref 31.5–35.7)
MCV: 92 fL (ref 79–97)
Monocytes Absolute: 0.4 10*3/uL (ref 0.1–0.9)
Monocytes: 6 %
Neutrophils Absolute: 3.8 10*3/uL (ref 1.4–7.0)
Neutrophils: 57 %
Platelets: 126 10*3/uL — ABNORMAL LOW (ref 150–450)
RBC: 4.97 x10E6/uL (ref 3.77–5.28)
RDW: 13.1 % (ref 11.7–15.4)
WBC: 6.7 10*3/uL (ref 3.4–10.8)

## 2024-10-06 LAB — CMP14+EGFR
ALT: 21 [IU]/L (ref 0–32)
AST: 26 [IU]/L (ref 0–40)
Albumin: 4.6 g/dL (ref 3.8–4.9)
Alkaline Phosphatase: 79 [IU]/L (ref 49–135)
BUN/Creatinine Ratio: 15 (ref 9–23)
BUN: 10 mg/dL (ref 6–24)
Bilirubin Total: 0.4 mg/dL (ref 0.0–1.2)
CO2: 22 mmol/L (ref 20–29)
Calcium: 9 mg/dL (ref 8.7–10.2)
Chloride: 100 mmol/L (ref 96–106)
Creatinine, Ser: 0.68 mg/dL (ref 0.57–1.00)
Globulin, Total: 2.3 g/dL (ref 1.5–4.5)
Glucose: 67 mg/dL — ABNORMAL LOW (ref 70–99)
Potassium: 4.4 mmol/L (ref 3.5–5.2)
Sodium: 142 mmol/L (ref 134–144)
Total Protein: 6.9 g/dL (ref 6.0–8.5)
eGFR: 102 mL/min/{1.73_m2}

## 2024-10-06 NOTE — Progress Notes (Signed)
 Hi [name], platelets are still little bit low but they actually look much better.  Blood count looks good and electrolytes are normal.

## 2024-10-07 NOTE — Telephone Encounter (Signed)
 Called pt and informed her that her FMLA forms have been completed. She asked if these could be emailed to her work email account: [name].   Pt was informed this is not something we could do because of HIPAA. She stated that she would come by and pick these up Monday. Will place up front for her to pick up.

## 2024-10-25 ENCOUNTER — Ambulatory Visit: Payer: Self-pay | Admitting: Neurology

## 2024-11-08 ENCOUNTER — Ambulatory Visit: Admitting: Family Medicine

## 2025-01-05 ENCOUNTER — Ambulatory Visit: Admitting: Family Medicine
